# Patient Record
Sex: Female | Born: 1975 | Race: White | Hispanic: No | Marital: Single | Smoking: Current every day smoker
Health system: Southern US, Community
[De-identification: ages and names within clinical notes are randomized; demographics above are authoritative.]

---

## 1999-09-06 ENCOUNTER — Other Ambulatory Visit: Admission: RE | Admit: 1999-09-06 | Discharge: 1999-09-06 | Payer: Self-pay | Admitting: Obstetrics and Gynecology

## 2000-10-30 ENCOUNTER — Ambulatory Visit (HOSPITAL_COMMUNITY): Admission: RE | Admit: 2000-10-30 | Discharge: 2000-10-30 | Payer: Self-pay | Admitting: Neurology

## 2002-01-22 ENCOUNTER — Other Ambulatory Visit: Admission: RE | Admit: 2002-01-22 | Discharge: 2002-01-22 | Payer: Self-pay | Admitting: Obstetrics and Gynecology

## 2002-08-06 ENCOUNTER — Ambulatory Visit (HOSPITAL_COMMUNITY): Admission: RE | Admit: 2002-08-06 | Discharge: 2002-08-06 | Payer: Self-pay | Admitting: Neurology

## 2003-03-02 ENCOUNTER — Other Ambulatory Visit: Admission: RE | Admit: 2003-03-02 | Discharge: 2003-03-02 | Payer: Self-pay | Admitting: Obstetrics and Gynecology

## 2003-09-13 ENCOUNTER — Ambulatory Visit (HOSPITAL_COMMUNITY): Admission: RE | Admit: 2003-09-13 | Discharge: 2003-09-13 | Payer: Self-pay | Admitting: Neurology

## 2003-12-22 ENCOUNTER — Ambulatory Visit (HOSPITAL_COMMUNITY): Admission: RE | Admit: 2003-12-22 | Discharge: 2003-12-22 | Payer: Self-pay | Admitting: Gastroenterology

## 2003-12-28 ENCOUNTER — Ambulatory Visit (HOSPITAL_COMMUNITY): Admission: RE | Admit: 2003-12-28 | Discharge: 2003-12-28 | Payer: Self-pay | Admitting: Gastroenterology

## 2004-01-22 ENCOUNTER — Emergency Department (HOSPITAL_COMMUNITY): Admission: EM | Admit: 2004-01-22 | Discharge: 2004-01-22 | Payer: Self-pay | Admitting: Emergency Medicine

## 2004-01-28 ENCOUNTER — Encounter: Admission: RE | Admit: 2004-01-28 | Discharge: 2004-01-28 | Payer: Self-pay | Admitting: Internal Medicine

## 2004-02-08 ENCOUNTER — Inpatient Hospital Stay (HOSPITAL_COMMUNITY): Admission: EM | Admit: 2004-02-08 | Discharge: 2004-02-17 | Payer: Self-pay | Admitting: Emergency Medicine

## 2004-03-22 ENCOUNTER — Ambulatory Visit (HOSPITAL_COMMUNITY): Admission: RE | Admit: 2004-03-22 | Discharge: 2004-03-22 | Payer: Self-pay | Admitting: Gastroenterology

## 2004-06-19 ENCOUNTER — Other Ambulatory Visit: Admission: RE | Admit: 2004-06-19 | Discharge: 2004-06-19 | Payer: Self-pay | Admitting: Obstetrics and Gynecology

## 2004-09-26 ENCOUNTER — Ambulatory Visit (HOSPITAL_COMMUNITY): Admission: RE | Admit: 2004-09-26 | Discharge: 2004-09-26 | Payer: Self-pay | Admitting: Neurology

## 2004-11-05 IMAGING — RF DG ERCP WO/W SPHINCTEROTOMY
9 series · 9 of 9 positions shown · non-contrast
Comparison: none

CLINICAL DATA: The patient has had a previous cholecystectomy, now thought to have a bile leak.
 ERCP AND STENT PLACEMENT
 ERCP was performed and showed what appeared to be a small bile leak in the region of the inferior surface of the right lobe of the liver. The exact point of leakage cannot be definitely ascertained, but there is definite accumulation of the contrast. Therefore, a 10 French 5 centimeter length stent was placed over the lower common bile duct.
 IMPRESSION 
 There appears to be a biliary leak from the region of the gallbladder fossa of uncertain origin. A 10 French 5 centimeter stent was placed in the lower common bile duct and across the ampulla.

[Series 1: run · 1 of 1 slices shown (1 of 9)]
[im 1/1]
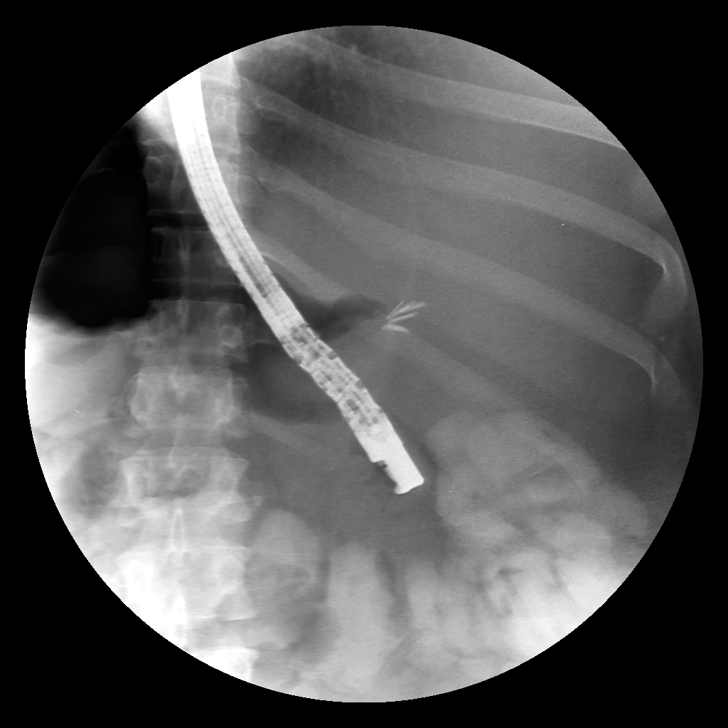

[Series 2: run · 1 of 1 slices shown (2 of 9)]
[im 1/1]
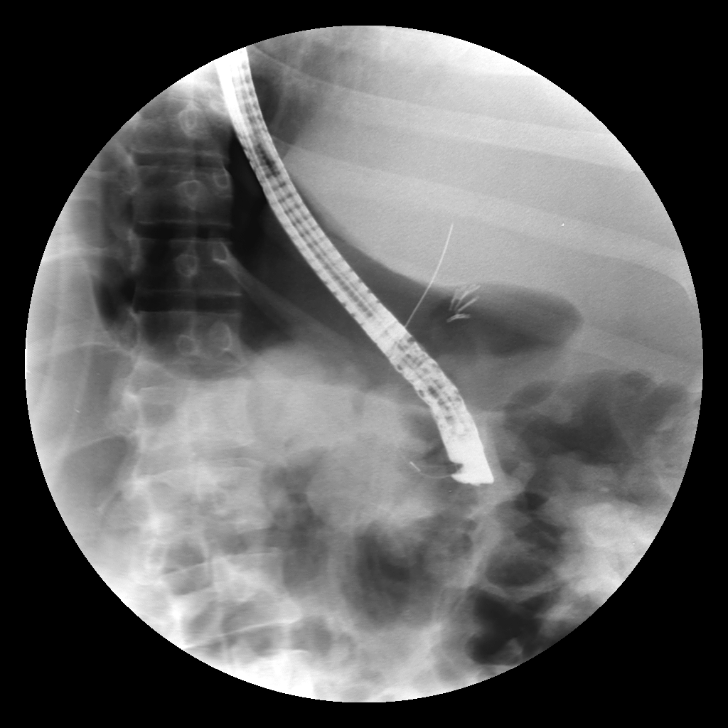

[Series 3: run · 1 of 1 slices shown (3 of 9)]
[im 1/1]
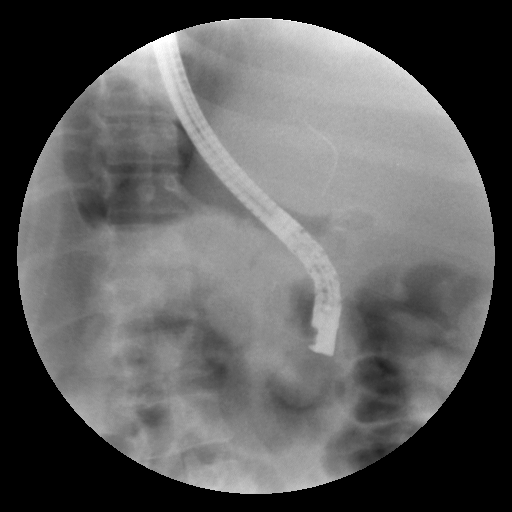

[Series 4: run · 1 of 1 slices shown (4 of 9)]
[im 1/1]
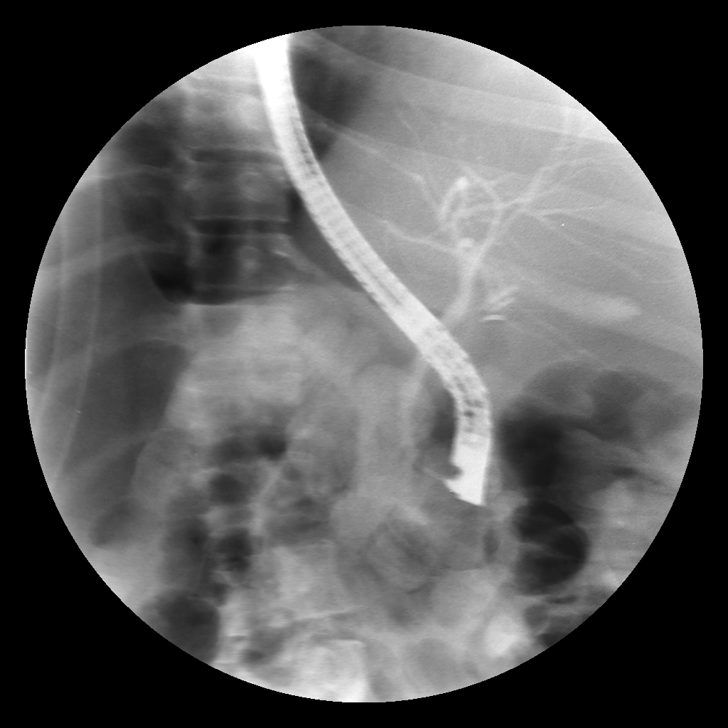

[Series 5: run · 1 of 1 slices shown (5 of 9)]
[im 1/1]
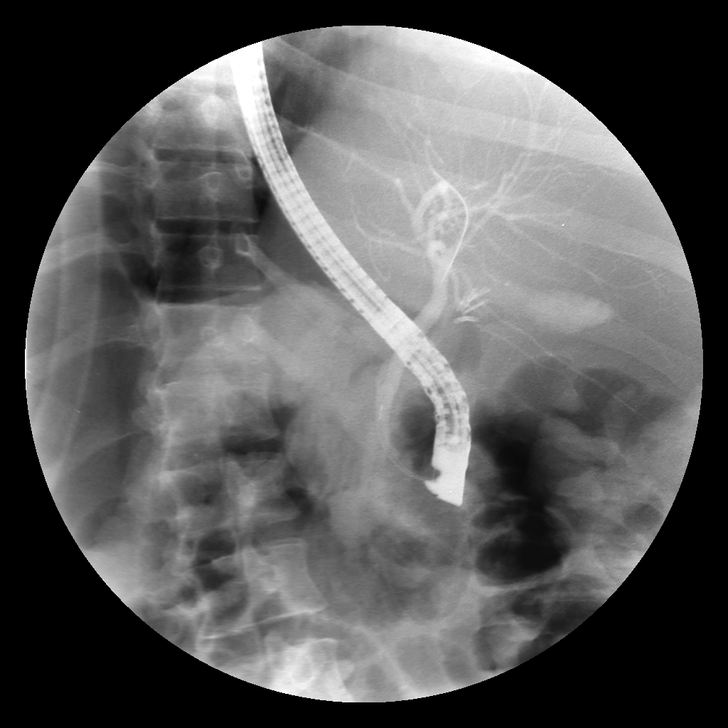

[Series 6: run · 1 of 1 slices shown (6 of 9)]
[im 1/1]
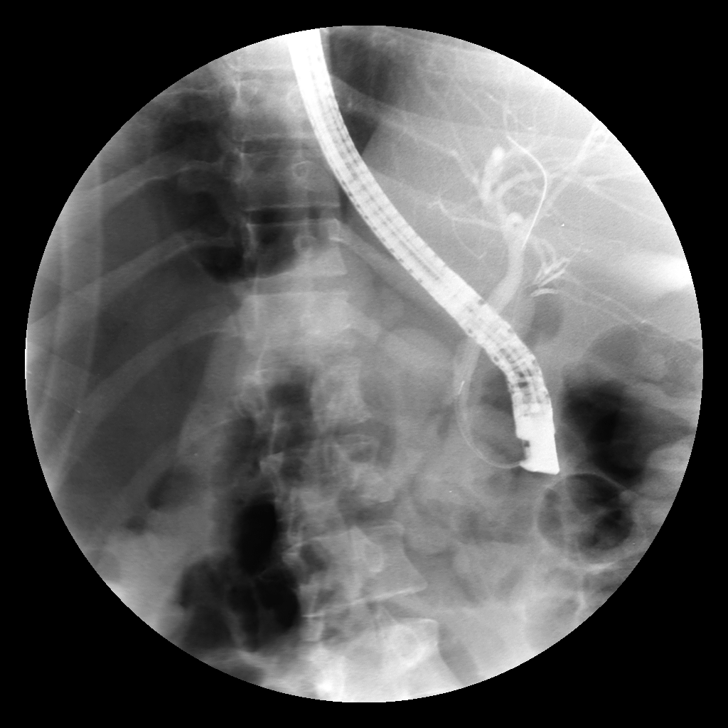

[Series 7: run · 1 of 1 slices shown (7 of 9)]
[im 1/1]
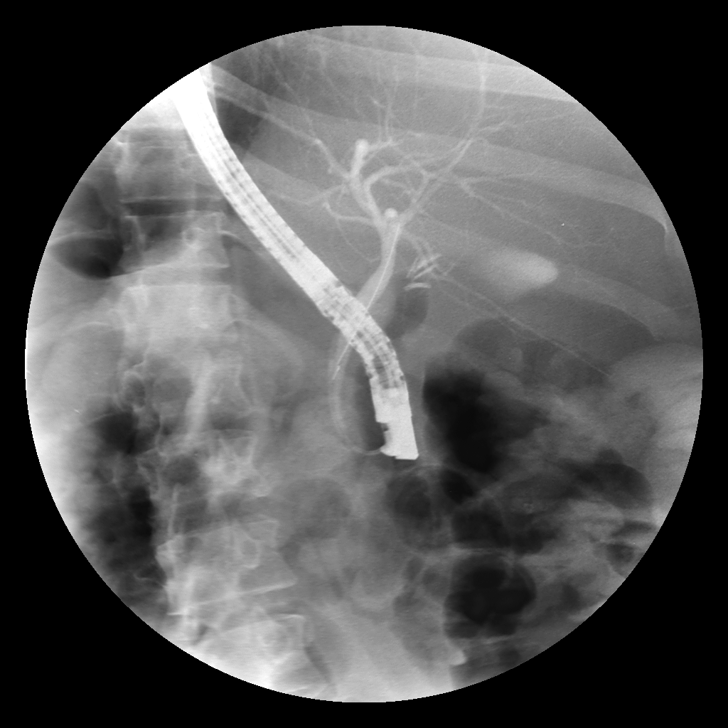

[Series 8: run · 1 of 1 slices shown (8 of 9)]
[im 1/1]
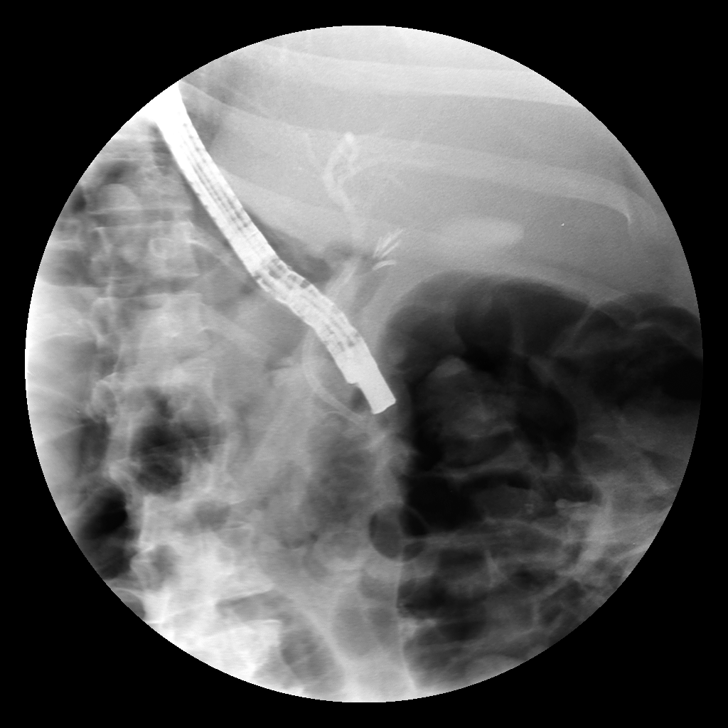

[Series 9: run · 1 of 1 slices shown (9 of 9)]
[im 1/1]
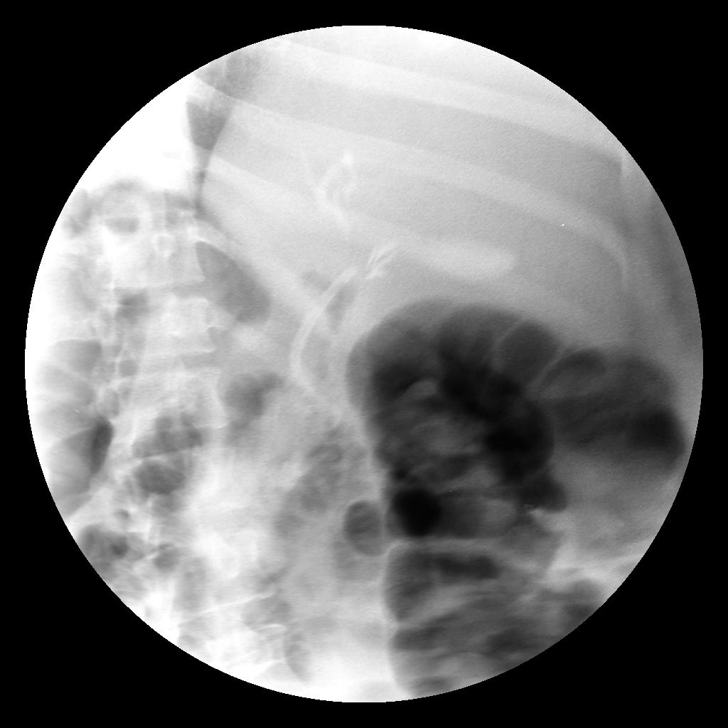

[9 of 9 positions shown; findings below may reference images not displayed]

## 2005-02-02 ENCOUNTER — Other Ambulatory Visit: Admission: RE | Admit: 2005-02-02 | Discharge: 2005-02-02 | Payer: Self-pay | Admitting: Obstetrics and Gynecology

## 2005-05-08 ENCOUNTER — Ambulatory Visit (HOSPITAL_COMMUNITY): Admission: RE | Admit: 2005-05-08 | Discharge: 2005-05-08 | Payer: Self-pay | Admitting: Neurology

## 2005-05-31 ENCOUNTER — Encounter: Admission: RE | Admit: 2005-05-31 | Discharge: 2005-05-31 | Payer: Self-pay | Admitting: Neurology

## 2005-06-05 ENCOUNTER — Ambulatory Visit (HOSPITAL_COMMUNITY): Admission: RE | Admit: 2005-06-05 | Discharge: 2005-06-05 | Payer: Self-pay | Admitting: Neurology

## 2005-11-20 ENCOUNTER — Other Ambulatory Visit: Admission: RE | Admit: 2005-11-20 | Discharge: 2005-11-20 | Payer: Self-pay | Admitting: Obstetrics and Gynecology

## 2005-12-14 ENCOUNTER — Ambulatory Visit (HOSPITAL_COMMUNITY): Admission: RE | Admit: 2005-12-14 | Discharge: 2005-12-14 | Payer: Self-pay | Admitting: Neurology

## 2006-01-29 ENCOUNTER — Ambulatory Visit (HOSPITAL_COMMUNITY): Admission: RE | Admit: 2006-01-29 | Discharge: 2006-01-29 | Payer: Self-pay | Admitting: Neurology

## 2006-05-08 ENCOUNTER — Ambulatory Visit (HOSPITAL_COMMUNITY): Admission: RE | Admit: 2006-05-08 | Discharge: 2006-05-08 | Payer: Self-pay | Admitting: Neurosurgery

## 2006-05-09 ENCOUNTER — Ambulatory Visit (HOSPITAL_COMMUNITY): Admission: RE | Admit: 2006-05-09 | Discharge: 2006-05-11 | Payer: Self-pay | Admitting: Neurosurgery

## 2006-06-14 ENCOUNTER — Inpatient Hospital Stay (HOSPITAL_COMMUNITY): Admission: RE | Admit: 2006-06-14 | Discharge: 2006-06-15 | Payer: Self-pay | Admitting: Neurosurgery

## 2006-06-20 ENCOUNTER — Inpatient Hospital Stay (HOSPITAL_COMMUNITY): Admission: EM | Admit: 2006-06-20 | Discharge: 2006-06-21 | Payer: Self-pay | Admitting: Emergency Medicine

## 2006-07-31 ENCOUNTER — Encounter: Admission: RE | Admit: 2006-07-31 | Discharge: 2006-07-31 | Payer: Self-pay | Admitting: Neurosurgery

## 2006-08-01 ENCOUNTER — Inpatient Hospital Stay (HOSPITAL_COMMUNITY): Admission: EM | Admit: 2006-08-01 | Discharge: 2006-08-09 | Payer: Self-pay | Admitting: Emergency Medicine

## 2006-08-01 DIAGNOSIS — L089 Local infection of the skin and subcutaneous tissue, unspecified: Secondary | ICD-10-CM | POA: Insufficient documentation

## 2006-08-07 ENCOUNTER — Ambulatory Visit: Payer: Self-pay | Admitting: Infectious Diseases

## 2006-08-26 ENCOUNTER — Ambulatory Visit: Payer: Self-pay | Admitting: Infectious Diseases

## 2006-08-29 DIAGNOSIS — E0590 Thyrotoxicosis, unspecified without thyrotoxic crisis or storm: Secondary | ICD-10-CM | POA: Insufficient documentation

## 2006-08-29 DIAGNOSIS — G932 Benign intracranial hypertension: Secondary | ICD-10-CM

## 2006-08-29 DIAGNOSIS — Z9089 Acquired absence of other organs: Secondary | ICD-10-CM | POA: Insufficient documentation

## 2006-08-29 DIAGNOSIS — I10 Essential (primary) hypertension: Secondary | ICD-10-CM | POA: Insufficient documentation

## 2006-09-25 ENCOUNTER — Ambulatory Visit: Payer: Self-pay | Admitting: Infectious Diseases

## 2006-11-28 ENCOUNTER — Encounter: Admission: RE | Admit: 2006-11-28 | Discharge: 2006-11-28 | Payer: Self-pay | Admitting: Neurology

## 2006-12-24 DIAGNOSIS — IMO0001 Reserved for inherently not codable concepts without codable children: Secondary | ICD-10-CM

## 2007-06-12 ENCOUNTER — Ambulatory Visit (HOSPITAL_COMMUNITY): Admission: RE | Admit: 2007-06-12 | Discharge: 2007-06-12 | Payer: Self-pay | Admitting: Neurology

## 2007-08-08 ENCOUNTER — Ambulatory Visit (HOSPITAL_COMMUNITY): Admission: RE | Admit: 2007-08-08 | Discharge: 2007-08-08 | Payer: Self-pay | Admitting: Neurology

## 2007-11-19 ENCOUNTER — Ambulatory Visit (HOSPITAL_COMMUNITY): Admission: RE | Admit: 2007-11-19 | Discharge: 2007-11-19 | Payer: Self-pay | Admitting: Neurology

## 2008-03-23 ENCOUNTER — Ambulatory Visit (HOSPITAL_COMMUNITY): Admission: RE | Admit: 2008-03-23 | Discharge: 2008-03-23 | Payer: Self-pay | Admitting: Neurology

## 2008-08-10 ENCOUNTER — Ambulatory Visit (HOSPITAL_COMMUNITY): Admission: RE | Admit: 2008-08-10 | Discharge: 2008-08-10 | Payer: Self-pay | Admitting: Neurology

## 2008-11-26 ENCOUNTER — Ambulatory Visit (HOSPITAL_COMMUNITY): Admission: RE | Admit: 2008-11-26 | Discharge: 2008-11-26 | Payer: Self-pay | Admitting: Neurology

## 2009-05-11 ENCOUNTER — Ambulatory Visit (HOSPITAL_COMMUNITY): Admission: RE | Admit: 2009-05-11 | Discharge: 2009-05-11 | Payer: Self-pay | Admitting: Neurology

## 2009-11-01 ENCOUNTER — Emergency Department (HOSPITAL_BASED_OUTPATIENT_CLINIC_OR_DEPARTMENT_OTHER): Admission: EM | Admit: 2009-11-01 | Discharge: 2009-11-01 | Payer: Self-pay | Admitting: Emergency Medicine

## 2009-12-13 ENCOUNTER — Ambulatory Visit (HOSPITAL_COMMUNITY): Admission: RE | Admit: 2009-12-13 | Discharge: 2009-12-13 | Payer: Self-pay | Admitting: Neurology

## 2010-08-31 ENCOUNTER — Emergency Department (HOSPITAL_COMMUNITY): Admission: EM | Admit: 2010-08-31 | Discharge: 2010-08-31 | Payer: Self-pay | Admitting: Emergency Medicine

## 2010-12-03 ENCOUNTER — Encounter: Payer: Self-pay | Admitting: Gastroenterology

## 2010-12-03 ENCOUNTER — Encounter: Payer: Self-pay | Admitting: Neurology

## 2011-01-24 LAB — DIFFERENTIAL
Lymphocytes Relative: 21 % (ref 12–46)
Lymphs Abs: 2.3 10*3/uL (ref 0.7–4.0)
Neutro Abs: 8.1 10*3/uL — ABNORMAL HIGH (ref 1.7–7.7)

## 2011-01-24 LAB — APTT: aPTT: 28 seconds (ref 24–37)

## 2011-01-24 LAB — URINALYSIS, ROUTINE W REFLEX MICROSCOPIC
Bilirubin Urine: NEGATIVE
Hgb urine dipstick: NEGATIVE
Ketones, ur: NEGATIVE mg/dL
pH: 6 (ref 5.0–8.0)

## 2011-01-24 LAB — BASIC METABOLIC PANEL
Calcium: 8.9 mg/dL (ref 8.4–10.5)
Chloride: 108 mEq/L (ref 96–112)
Potassium: 3.4 mEq/L — ABNORMAL LOW (ref 3.5–5.1)
Sodium: 138 mEq/L (ref 135–145)

## 2011-01-24 LAB — CBC: MCHC: 33.8 g/dL (ref 30.0–36.0)

## 2011-01-24 LAB — RAPID URINE DRUG SCREEN, HOSP PERFORMED
Benzodiazepines: POSITIVE — AB
Tetrahydrocannabinol: POSITIVE — AB

## 2011-01-24 LAB — URINE MICROSCOPIC-ADD ON

## 2011-01-24 LAB — PROTIME-INR: INR: 0.95 (ref 0.00–1.49)

## 2011-01-31 LAB — PROTEIN AND GLUCOSE, CSF
Glucose, CSF: 65 mg/dL (ref 43–76)
Total  Protein, CSF: 18 mg/dL (ref 15–45)

## 2011-01-31 LAB — CSF CELL COUNT WITH DIFFERENTIAL
RBC Count, CSF: 32 /mm3 — ABNORMAL HIGH
Tube #: 1

## 2011-02-26 LAB — CSF CELL COUNT WITH DIFFERENTIAL
RBC Count, CSF: 8 /mm3 — ABNORMAL HIGH
Tube #: 1
WBC, CSF: 1 /mm3 (ref 0–5)

## 2011-02-26 LAB — PROTEIN AND GLUCOSE, CSF
Glucose, CSF: 64 mg/dL (ref 43–76)
Total  Protein, CSF: 24 mg/dL (ref 15–45)

## 2011-03-27 NOTE — Op Note (Signed)
[name], [name]            ACCOUNT NO.:  [id number]   MEDICAL RECORD NO.:  [id number]          PATIENT TYPE:  OUT   LOCATION:  MDC                          FACILITY:  [facility]   PHYSICIAN:  Michael L. Reynolds, M.D.DATE OF BIRTH:  [birth year]   DATE OF PROCEDURE:  11/19/2007  DATE OF DISCHARGE:                               OPERATIVE REPORT   PROCEDURE:  Therapeutic lumbar puncture.   OPERATOR:  Michael L. Thad Ranger, M.D.   INDICATION:  Idiopathic intracranial hypertension with increasing  headache.   DESCRIPTION OF PROCEDURE:  The patient was placed in the right lateral  decubitus position and prepped and draped in the usual sterile fashion.  Local anesthesia was achieved with 5 mL of lidocaine.  A single attempt  was made to pass a 20-gauge spinal needle through the L3-4 interspace,  which was successful.  Clear CSF emerged with opening pressure of 285  mmH2O.  A total of approximately 30 mL of clear CSF was withdrawn and  sent for laboratory analysis, including cell count, differential,  glucose and protein.  Closing pressure was measured at approximately 130  mmH2O.  The needle was withdrawn and hemostasis was obtained.  No  immediate complications were noted.  The patient will be advised to  remain supine for an hour and then may be discharged home.  She is given  the usual post-LP precautions, including coming to the emergency room  immediately if she develops fever, altered mental status, neck  stiffness, inability to control the bladder or bowels, or paraparesis,  and to call the office or the on-call physician over the next few days  if she develops a postural headache.      Michael L. Thad Ranger, M.D.  Electronically Signed     MLR/MEDQ  D:  11/19/2007  T:  11/19/2007  Job:  [id number]

## 2011-03-27 NOTE — Op Note (Signed)
[name], [name]            ACCOUNT NO.:  [id number]   MEDICAL RECORD NO.:  [id number]          PATIENT TYPE:  OUT   LOCATION:  MDC                          FACILITY:  [facility]   PHYSICIAN:  Michael L. Reynolds, M.D.DATE OF BIRTH:  09/07/1976   DATE OF PROCEDURE:  DATE OF DISCHARGE:                               OPERATIVE REPORT   PROCEDURE:  Therapeutic lumbar puncture.   OPERATOR:  Michael L. Reynolds, MD   INDICATION:  Idiopathic intracranial hypertension (pseudotumor cerebri).   DESCRIPTION OF PROCEDURE:  Informed consent form was signed and placed  in the chart.  After the procedure, risks and benefits were explained to  the patient, she agreed to proceed.  The patient was placed in the right  lateral decubitus position and prepped and draped in the usual sterile  fashion.  Local anesthesia was achieved with 9 mL of lidocaine.  A 20-  gauge spinal needle was inserted at the L4-L5 interspace and advanced  until clear CSF was obtained.  Opening pressure was measured at 320 mm  of water.  Approximately 30 mL of spinal fluid was withdrawn, with  closing pressure measured at 110 mm of water.  Needle was withdrawn and  hemostasis was obtained.  No immediate complications were noted.  The  patient was advised to remain in the supine position for the next hour,  and then will be discharged home.  She was given the usual postoperative  precautions including to coming to the emergency department immediately  if she developed fever, mental status changes, paraparesis, bowel or  bladder incontinence, or unrelenting low back pain, and to call the  office in the next few days if she developed a postural headache.      Michael L. Thad Ranger, M.D.  Electronically Signed     MLR/MEDQ  D:  05/11/2009  T:  05/12/2009  Job:  [id number]

## 2011-03-27 NOTE — Op Note (Signed)
NAMEJAYLIN, ROUNDY            ACCOUNT NO.:  [id number]   MEDICAL RECORD NO.:  [id number]          PATIENT TYPE:  OUT   LOCATION:  MDC                          FACILITY:  [facility]   PHYSICIAN:  Michael L. Reynolds, M.D.DATE OF BIRTH:  01/20/1976   DATE OF PROCEDURE:  DATE OF DISCHARGE:                               OPERATIVE REPORT   PROCEDURE:  Therapeutic lumbar puncture.   OPERATOR:  Michael L. Reynolds, MD   INDICATION:  Idiopathic intracranial hypertension (pseudotumor cerebri)  with an increasing headache.   DESCRIPTION OF PROCEDURE:  Informed consent was obtained.  Informed the  son and placed in the chart.  After the procedure, risks and benefits of  the procedure were explained to the patient, she agreed to proceed.  The  patient was placed in the right lateral decubitus position and prepped  and draped in the usual sterile fashion.  Local anesthesia was achieved  with 4 mL of lidocaine.  A 20-gauge spinal needle was inserted at the L4-  L5 interspace and advanced until clear CSF was obtained.  Opening  pressure was 290 mm of water.  Approximately 15 mL of clear CSF were  removed and sent for laboratory testing as follows:  Tube 1, cell count  differential and tube 2, glucose and protein.  A closing pressure was  measured at 120 mm of water.  Needle was withdrawn and hemostasis was  obtained.  No immediate complications were noted.  The patient was  advised to remain supine for the next hour, and then maybe discharged  from short stay.  She was given the usual postoperative precautions  including coming to the emergency department if she developed  unrelenting low back pain, inability to walk, numbness or weakness of  the lower extremities, bowel or bladder incontinence, fever, neck  stiffness or mental status changes.  She is also advised to call up the  office or the on-call physician over the next couple of days if she  developed postural headache.  She will be seen  back in the office as  routinely scheduled.      Michael L. Thad Ranger, M.D.  Electronically Signed     MLR/MEDQ  D:  11/26/2008  T:  11/27/2008  Job:  [id number]

## 2011-03-27 NOTE — Op Note (Signed)
[name], [name]            ACCOUNT NO.:  [id number]   MEDICAL RECORD NO.:  [id number]          PATIENT TYPE:  OUT   LOCATION:  MDC                          FACILITY:  [facility]   PHYSICIAN:  Michael L. Reynolds, M.D.DATE OF BIRTH:  Nov 01, 1976   DATE OF PROCEDURE:  08/08/2007  DATE OF DISCHARGE:                               OPERATIVE REPORT   PROCEDURE:  Diagnostic/therapeutic lumbar puncture.   INDICATIONS:  Pseudotumor cerebri.  She has had a persistent headache  since her last tap.  This LP is performed for measurement of pressure to  exclude high or low pressure headache.   OPERATOR:  Michael L. Reynolds, M.D.   DESCRIPTION OF PROCEDURE:  Informed consent form was placed inside the  chart after the procedure risks and benefits were explained to the  patient.  She agreed to proceed.  The patient was placed in the right  lateral decubitus position and prepped and draped in the usual sterile  fashion.  Local anesthesia was achieved with 5 mL of lidocaine at the L3-  4 level.  A 20 gauge spinal needle was advanced in the L3-4 and was felt  to be in the interspace, but no fluid was returned.  Subsequently, the  L2-3 level was anesthetized with 5 mL of lidocaine.  The needle was  advanced until clear CSF was obtained.  Opening pressure was 220 mm of  water.  Approximately 12 mL of CSF were collected and sent for routine  laboratory testing including CBC, differential, glucose and protein.  Closing pressure was measured at 135 mm of water.  The needle was  withdrawn and hemostasis was obtained.  No immediate complications were  noted.   The patient will remain supine for an hour following the procedure and  then will be discharged home.  She will be given the usual post-LP  precautions, including coming to the emergency department if she  develops, fever, alteration in mental status, unremitting low back pain,  weakness or numbness in the legs, bowel or bladder incontinence or  to  call the office or the on-call doctor over the next several days if she  develops a postural headache.      Michael L. Thad Ranger, M.D.  Electronically Signed     MLR/MEDQ  D:  08/08/2007  T:  08/08/2007  Job:  [id number]

## 2011-03-27 NOTE — Op Note (Signed)
[name], CONSTANTIN            ACCOUNT NO.:  [id number]   MEDICAL RECORD NO.:  [id number]          PATIENT TYPE:  AMB   LOCATION:  SDS                          FACILITY:  [facility]   PHYSICIAN:  Michael L. Reynolds, M.D.DATE OF BIRTH:  [birth year]   DATE OF PROCEDURE:  06/12/2007  DATE OF DISCHARGE:                               OPERATIVE REPORT   PROCEDURE PERFORMED:  Therapeutic lumbar puncture.   SURGEON:  Casimiro Needle L. Thad Ranger, M.D.   INDICATIONS FOR PROCEDURE:  Pseudotumor cerebri with increasing  headache.   DESCRIPTION OF PROCEDURE:  The informed consent form was signed and  placed in the chart after the procedure, risks and benefits were  explained to the patient and she agreed to proceed.  The patient was  placed in the right lateral decubitus position, and prepped and draped  in the usual sterile manner.  Local anesthesia was achieved with 7 mL of  lidocaine.  A 20-gauge spinal needle was inserted into the L3-L4  interspace and advanced until clear CSF was obtained.  Opening pressure  was measured at 29 cm of water.  Approximately 24 mL of CSF was  subsequently drained without difficulty.  The closing pressure was  measured at 11 cm of water.  The needle was withdrawn and hemostasis was  obtained.   The patient will remain supine in the short stay area for one hour and  then will be discharged home.  Routine CSF labs including cell count,  glucose and protein will be obtained.  The patient was given the usual  post LP precautions and advised to follow up in the office in a few  weeks or to call sooner if she developed numbness, tingling or weakness  in the lower extremities, bowel or bladder incontinence, unremitting low  back pain, fever or alteration of consciousness, and to call over the  next few days if she develops a postural headache.      Michael L. Thad Ranger, M.D.  Electronically Signed     MLR/MEDQ  D:  06/12/2007  T:  06/12/2007  Job:  [id number]

## 2011-03-27 NOTE — Op Note (Signed)
NAMEADANNA, ZUCKERMAN            ACCOUNT NO.:  [id number]   MEDICAL RECORD NO.:  [id number]          PATIENT TYPE:  OUT   LOCATION:  MDC                          FACILITY:  [facility]   PHYSICIAN:  Michael L. Reynolds, M.D.DATE OF BIRTH:  Mar 14, 1976   DATE OF PROCEDURE:  08/10/2008  DATE OF DISCHARGE:                               OPERATIVE REPORT   PROCEDURE:  Therapeutic lumbar puncture.   OPERATOR:  Michael L. Reynolds, MD.   INDICATION:  Pseudotumor cerebri.   DESCRIPTION OF PROCEDURE:  Informed consent was obtained and form was  signed, placed in the chart after the procedure risks and benefits were  explained to the patient, and she agreed to proceed.  The patient was  placed in the right lateral decubitus position and prepped and draped in  the usual sterile fashion.  Local anesthesia was achieved with  lidocaine.  Initially attempts were made to pass a 20-gauge spinal  needle through the L3-4 interspace.  This resulted in cannulation of the  space, without return of CSF.  After multiple attempts, failed to return  fluid, the skin and subcutaneous tissue above the L4-5 interspace was  anesthetized with lidocaine.  A 20-gauge spinal needle was then  successfully passed into the thecal sac, and clear fluid was returned.  Opening pressure was 230 mm of water.  Approximately 20 mL was then  drawn off, for closing pressure of 110 mm of water.  The needle was  withdrawn.  Hemostasis was obtained.  No immediate complications were  noted.  The patient will remain supine in the Short-Stay area for 1  hour, then will be discharged home.  She will be given usual post LP  precautions including presenting to the emergency department for fever,  stiff neck, underlying low back pain, weakness in lower extremities, or  mental status changes, and to call over the next few days if she  develops an unrelenting postural headache.  She will follow up in the  office in few weeks as  scheduled.      Michael L. Thad Ranger, M.D.  Electronically Signed     MLR/MEDQ  D:  08/10/2008  T:  08/11/2008  Job:  [id number]

## 2011-03-30 NOTE — Op Note (Signed)
NAMEERSEL, [name]            ACCOUNT NO.:  [id number]   MEDICAL RECORD NO.:  [id number]          PATIENT TYPE:  OUT   LOCATION:  MDC                          FACILITY:  [facility]   PHYSICIAN:  Michael L. Reynolds, M.D.DATE OF BIRTH:  1976/08/16   DATE OF PROCEDURE:  05/08/2005  DATE OF DISCHARGE:                                 OPERATIVE REPORT   PROCEDURE:  Therapeutic lumbar puncture.   OPERATOR:  Michael L. Thad Ranger, M.D.   INDICATION:  Idiopathic intracranial hypertension with increasing headache.   DESCRIPTION OF PROCEDURE:  Informed consent form was signed and placed in  the chart after the procedure risks and benefits were explained and the  patient agreed to proceed.  The patient was placed in the right lateral  decubitus position and prepped and draped in the usual sterile fashion.  Local anesthesia was achieved with 7 cubic centimeters of lidocaine.  A 20  gauge spinal needle was inserted into theL3-4 interspace and advanced until  clear CSF was obtained.  Opening pressure was 320 mm of water.  CSF was  withdrawn into two tubes for routine measure of cell count, differential,  glucose, and protein.  Additional fluid was then drained to achieve a  closing pressure of 190 mm of water.  The needle was withdrawn and  hemostasis was obtained.  No immediate complications were noted.  The  patient will be discharged following one-hour observation following the  procedure.  She will be in the usual post LP precautions including alerting  [place] immediately if she develops fever, neck stiffness, mental status changes,  bowel or bladder incontinence, or numbness or paralysis of the lower  extremities, and to call over the next couple of days if she develops a  postural headache.       MLR/MEDQ  D:  05/08/2005  T:  05/08/2005  Job:  [id number]

## 2011-03-30 NOTE — Consult Note (Signed)
[name], BRAMEL                        ACCOUNT NO.:  [id number]   MEDICAL RECORD NO.:  [id number]                   PATIENT TYPE:   LOCATION:                                       FACILITY:   PHYSICIAN:  Ollen Gross. Vernell Morgans, M.D.              DATE OF BIRTH:   DATE OF CONSULTATION:  02/10/2004  DATE OF DISCHARGE:                                   CONSULTATION   CONSULTING PHYSICIAN:  Ollen Gross. Carolynne Edouard, M.D.   [name] is a 35 year old white female who underwent laparoscopic  cholecystectomy by Dr. Maryagnes Amos, on January 19, 2004.  She tolerated this well  initially but on post-op day three developed fevers to 102 and generalized  malaise.  She also experienced nausea and some vomiting.  Over the last few  days, the nausea and vomiting have resolved.  She continues to be febrile,  and she has no complaints of abdominal pain.  She underwent a recent MRI  which did show some fluid in the gallbladder fossa the size of which seemed  to be stable compared to previuos CTs, but it also showed some small fluid  at the tip of the liver as well as the tip of the spleen which was new.  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   PAST MEDICAL HISTORY:  1. Pseudotumor cerebri.  2. Obesity.  3. Hypothyroidism.   PAST SURGICAL HISTORY:  Laparoscopic cholecystectomy.   MEDICATIONS:  Oral contraceptives, aspirin, and Synthroid.   ALLERGIES:  1. CODEINE.  2. TEQUIN.   SOCIAL HISTORY:  She quit smoking about two years ago.  Denies any alcohol  use.   FAMILY HISTORY:  Significant for hypertension in her mother and father.   PHYSICAL EXAMINATION:  VITAL SIGNS:  Her T-max in the last 24 hours was  101.9, blood pressure 140/80, pulse of 119.  GENERAL:  She is a well-developed, well-nourished, white female in no acute  distress.  SKIN:  Warm and dry with no jaundice.  EYES:  Her extraocular muscles are intact.  Pupils equal, round and reactive  to light.   Sclerae nonicteric.  LUNGS:  Clear bilaterally with no use of accessory respiratory muscles.  HEART:  Regular, rate and rhythm with an impulse in the left chest.  ABDOMEN:  Soft and nontender with no palpable mass or hepatosplenomegaly.  Her incisions are healing nicely.  EXTREMITIES:  No cyanosis, clubbing or edema.  PSYCHOLOGIC:  She is alert and oriented  x 3 with no evidence of anxiety or  depression.   Her most recent white count was 6,900 with a hemoglobin of 10.2, a platelet  count of 135,000.  Her most recent sodium was 137, potassium 3.5, chloride  106, CO2 26, BUN 8, creatinine 1.1, glucose 103.  Her SGOT and SGPT were 131  and 121, alk phos 107 and total bili 0.9.  ASSESSMENT/PLAN:  This is a 35 year old white female, status post  laparoscopic cholecystectomy, about three weeks ago, who has had some  persistent generalized malaise, and nausea, and fevers.  It is certainly  possible that the fluid that showed up on the MRI study today could be a  very small bile leak, although the size of the fluid in the gallbladder  fossa is stable from CT scans from two weeks ago and does not seemed to have  changed much, and she had a normal hepatobiliary scan at that time as well.  Certainly, if this is a small bile leak, she is scheduled for an ERCP  tomorrow which should be able to demonstrate it and also will be therapeutic  with stenting of the bile duct.  The fluid itself is too small to tap or  drain and, I believe, will simply require close monitoring.  It is unclear  what the significance of the splenic infarct is on the MR study, and this  seems to be a new finding over the last few days as the splenic did not show  up on the CT scan from two weeks ago.  We will await the results of the ERCP  tomorrow and continue to follow her closely with you.                                               Ollen Gross. Vernell Morgans, M.D.    PST/MEDQ  D:  02/10/2004  T:  02/12/2004  Job:  [id number]

## 2011-03-30 NOTE — Op Note (Signed)
[name], [name]                      ACCOUNT NO.:  [id number]   MEDICAL RECORD NO.:  [id number]                   PATIENT TYPE:  INP   LOCATION:  5040                                 FACILITY:  [facility]   PHYSICIAN:  Graylin Shiver, M.D.                DATE OF BIRTH:  Jun 18, 1976   DATE OF PROCEDURE:  02/11/2004  DATE OF DISCHARGE:                                 OPERATIVE REPORT   PROCEDURE:  Endoscopic retrograde cholangiogram with biliary stent  placement.   INDICATIONS FOR PROCEDURE:  Suspected bile leak status post cholecystectomy.   CONSENT:  Informed consent was obtained after explanation of the risks of  bleeding, infection, perforation, and pancreatitis.   PREMEDICATION:  Fentanyl 175 mcg IV, Versed 20 mg IV, 0.25 mg Glucagon.   PROCEDURE IN DETAIL:  With the patient lying on her abdomen on the  fluoroscopy table in x-ray, the lateral viewing duodenoscope was inserted  into the oropharynx and passed into the esophagus.  It was advanced down the  esophagus, into the stomach, and into the duodenum.  The papilla of Vater  was localized, it looked normal.  I could see some clear bile draining from  this.  Cannulation was initially achieved of the pancreatic duct using a  guide-wire.  No contrast was injected.  The pancreatic duct was catheterized  several times with a guide-wire but after repositioning, I was able to  selectively cannulate the common bile duct with the guide-wire and  sphincterotome.  Contrast was then injected selectively into the biliary  tree.  Radiographs were obtained.  The radiologist was present during the  initial injection.  We were able to see a small blush of extravasated  contrast coming out of a small duct just adjacent to the region of the clips  of prior cholecystectomy.  A 10 French 5 cm biliary stent was then placed  into the common bile duct with good bile flow noted.  She tolerated the  procedure well without complications.   IMPRESSION:  Small bile leaking coming off a small duct adjacent to the  clips from her recent cholecystectomy.  A 10 French 5 cm biliary stent was  placed in the common bile duct.   I would recommend leaving the stent in place for 4-6 weeks.  We could then  bring her back as an outpatient to remove the stent.                                               Graylin Shiver, M.D.    Germain Osgood  D:  02/11/2004  T:  02/11/2004  Job:  [id number]   cc:   Lilla Shook, M.D.  [address]  [place]  [place] [address]  Fax: [phone]   Darius Bump,  M.D.  [address]  [place] [address]  Fax: [phone]   Gita Kudo, M.D.  [address], [address]  [place]  [place] [address]  Fax: [phone]

## 2011-03-30 NOTE — Op Note (Signed)
NAME:  BROWNIE, GOCKEL                      ACCOUNT NO.:  [id number]   MEDICAL RECORD NO.:  [id number]                   PATIENT TYPE:  AMB   LOCATION:  ENDO                                 FACILITY:  [facility]   PHYSICIAN:  Graylin Shiver, M.D.                DATE OF BIRTH:  1976/10/04   DATE OF PROCEDURE:  03/22/2004  DATE OF DISCHARGE:                                 OPERATIVE REPORT   PROCEDURE PERFORMED:  Upper endoscopy with biliary stent removal.   ENDOSCOPIST:  Graylin Shiver, M.D.   INDICATIONS FOR PROCEDURE:  This patient had a bile leak, status post  cholecystectomy.  She had a biliary stent placed on February 11, 2004.  She had  done well since the stent placement.  She feels fine.  She is here today for  biliary stent removal.  Informed consent was obtained after the risks of  bleeding, infection and perforation.   PREMEDICATIONS:  Fentanyl 80 mcg IV, Versed 7 mg IV.   DESCRIPTION OF PROCEDURE:  With the patient in the left lateral decubitus  position the Olympus lateral viewing duodenoscope was inserted into the  oropharynx and passed into the esophagus.  It was advanced down the  esophagus and into the stomach and into the duodenum.  No abnormalities were  noted in the stomach or the duodenum.  The biliary stent was seen coming out  of the ampulla of Vater.  It was grasped with a snare and removed and  brought out.  The patient tolerated the procedure well without  complications.   IMPRESSION:  Biliary stent removed.                                               Graylin Shiver, M.D.    Germain Osgood  D:  03/22/2004  T:  03/22/2004  Job:  [id number]   cc:   Lilla Shook, M.D.  [address]  [place]  [place] [address]  Fax: [phone]   Darius Bump, M.D.  [phone] [address]  [place] [address]  Fax: [phone]   Gita Kudo, M.D.  [address], [address]  [place]  [place] [address]  Fax: [phone]

## 2011-03-30 NOTE — Op Note (Signed)
[name], [name]            ACCOUNT NO.:  [id number]   MEDICAL RECORD NO.:  [id number]           PATIENT TYPE:   LOCATION:                                 FACILITY:   PHYSICIAN:  Coletta Memos, M.D.          DATE OF BIRTH:   DATE OF PROCEDURE:  06/14/2006  DATE OF DISCHARGE:                                 OPERATIVE REPORT   PREOPERATIVE DIAGNOSIS:  1.  Shunt malfunction.  2.  Pseudotumor cerebri.   POSTOPERATIVE DIAGNOSIS:  1.  Shunt malfunction.  2.  Pseudotumor cerebri.   PROCEDURE:  Revision of lumboperitoneal shunt.   CO-SURGEONS:  Coletta Memos, M.D. and Cherylynn Ridges, M.D.   ANESTHESIA:  General endotracheal.   INDICATIONS:  [name] is a 35 year old woman with pseudotumor  cerebri.  She had a lumboperitoneal shunt placed approximately 3 weeks ago.  She was doing well, but then subsequently had a fluid collection underneath  her lumbar incision.  I had her go for a CT scan of the abdomen; and it  showed that all of the catheter had pulled out of the peritoneum and it  coiled itself into the lumbar area.  I, therefore, had her come in today for  revision of the shunt.   OPERATIVE NOTE:  For the first part of the operation, I placed [name]  in a lateral decubitus position with the left side up.  Her abdominal  incision, flank and back were all prepped and draped in a sterile fashion.  I opened the lumbar incision and when I got through the superficial tissue I  had the immediate egress of clear fluid which certainly had the appearance  of spinal fluid.  Pseudocapsule had formed.  I was able then untangle the  coiled catheter; and I had spontaneous flow out of the distal end of the  catheter.  There was also an area which appeared to have a slit in it; and I  was able identify that; and with a straight connector cut out that area, and  then reconnected the lumbar portion of the catheter.  With spontaneous flow  achieved, I then threaded the catheter  subcutaneously from the lumbar region  to the abdominal incision; and I placed it there; and then placed 3 sutures  to close that wound.   I closed the flank wound with a Vicryl suture and then subsequent staples.  Then after she had spontaneous flow from the spine, I closed that wound in  layered fashion using Vicryl sutures; and then dressed that with Dermabond.  At this point in time I then had the patient placed back in a supine  position; and her abdomen was then prepped; and she was draped again.  At  this point Dr. Megan Mans opened the abdominal incision; and under separate  cover will dictate his opening.  Once he had achieved an opening into the  peritoneum, I then placed a catheter after lengthening it by attaching a  straight stainless steel connector in line with it; and placed that into the  peritoneal cavity without  difficulty.  I placed the pursestring around the peritoneal opening, a 3-0  silk suture.  I then reapproximated the rectus abdominis fascia and then I  closed the wound in a layered fashion using the Vicryl sutures and then  staples on the skin.  [name]'s tolerated the procedure well.           ______________________________  Coletta Memos, M.D.     KC/MEDQ  D:  06/14/2006  T:  06/15/2006  Job:  [id number]

## 2011-03-30 NOTE — Procedures (Signed)
. [facility]  Patient:    [name], [name]                   MRN: [id number] Proc. Date: 10/30/00 Adm. Date:  [id number] Attending:  Fenton Malling                           Procedure Report  DATE OF BIRTH:  1976-09-23.  PROCEDURE PERFORMED:  Diagnostic/therapeutic lumbar puncture.  OPERATIVE FINDINGS:  [name], M.D.  INDICATIONS FOR PROCEDURE:  Suspected idiopathic intracranial hypertension.  DESCRIPTION OF PROCEDURE:  Informed consent was obtained and placed in the chart after the indications, benefits and risks were discussed with the patient and she agreed to proceed.  The low back was prepped and draped in the usual sterile fashion.  Local anesthesia was achieved with 2 cc of 1% lidocaine.  A 20 gauge spinal needle was gradually inserted into the subcutaneous tissues and moved to the L3-4  interspace with the patient in the right lateral decubitus position and the needle was advanced until clear spinal fluid was obtained.  Opening pressure was measured and was 520 mmH2O. Approximately 8 cc of fluid was subsequently withdrawn and was sent for the following studies.  Tube #1 cell count and differential, tube #2 protein and glucose, tube #3 Gram stain and culture, tube #4 hold.  Additional fluid was also withdrawn to reduce the closing pressure.  Closing pressure was 200 mmH2O.  The patient did complain of a mild headache during the procedure.  The needle was withdrawn and hemostasis obtained.  The patient was placed again in a flat supine position and reported that her headache was much improved upon assuming that position.  A sterile bandage was placed over the injection site. No further complications were noted. DD:  10/30/00 TD:  10/30/00 Job: [id number]

## 2011-03-30 NOTE — Discharge Summary (Signed)
NAMEMESHIA, [name]            ACCOUNT NO.:  [id number]   MEDICAL RECORD NO.:  [id number]          PATIENT TYPE:  INP   LOCATION:  3008                         FACILITY:  [facility]   PHYSICIAN:  Coletta Memos, M.D.     DATE OF BIRTH:  11/08/1976   DATE OF ADMISSION:  08/01/2006  DATE OF DISCHARGE:  08/09/2006                                 DISCHARGE SUMMARY   ADMISSION DIAGNOSES:  1. Abdominal pain.  2. Fever, unknown etiology.   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   PROCEDURE:  August 04, 2006, removal of lumbar peritoneal shunt.   FINDINGS:  Positive cultures from the distal tip and CSF Acinetobacter  baumannii. The patient is being treated with Imipenem 500 mg q.6h. x14 days.  She will be sent home for home IV therapy.  At discharge, she is alert,  oriented x4 and answering all questions appropriately.  Her lumbar incision  was oversewn yesterday as she was leaking what I presented to be spinal  fluid from that region.  There was no leak at the time of discharge.  She  has a normal neurologic examination at discharge.   [name] was admitted after running a fever and have an elevated white  count up to 18,000.  She was febrile to over 103 degrees while admitted.  She was eventually taken to the OR on August 04, 2006, for removal of the  shunt.  Postoperatively, she has done well.  She had a CT of the pelvis  which showed a ovarian cyst complex fluid collection.  She did have a  gynecologic consultation.  He has made plans to see her post discharge.  She  will see me in approximately 14 days for suture removal from her spine.  Her  abdominal wound is clean, dry and not showing any signs of infection nor is  the posterior wound.  She also received some Vicodin 10 mg 325, hydrocodone  10/325 and Flexeril.           ______________________________  Coletta Memos, M.D.     KC/MEDQ  D:  08/09/2006  T:  08/11/2006  Job:  [id number]

## 2011-03-30 NOTE — H&P (Signed)
NAMEPRABHLEEN, [name]            ACCOUNT NO.:  [id number]   MEDICAL RECORD NO.:  [id number]          PATIENT TYPE:  INP   LOCATION:  3008                         FACILITY:  [facility]   PHYSICIAN:  Payton Doughty, M.D.      DATE OF BIRTH:  25-Apr-1976   DATE OF ADMISSION:  08/01/2006  DATE OF DISCHARGE:                                HISTORY & PHYSICAL   ADMITTING DIAGNOSIS:  Abdominal pain and pseudotumor cerebri.   SERVICE:  Neurosurgery.   HISTORY:  This is a 35 year old, right handed, white female with abdominal  pain.  She had a lumboperitoneal shunt placed in early August, required two  abdominal approaches on consecutive days, then revision in mid-August for a  thecal sack dislodgment.  She said she has had abdominal pain since the  initial operation but worsened yesterday.  I saw her in the office  yesterday.  Abdominal exam was benign with positive bowel sounds.  She had a  white count of 12,000.  CT showed mild colonic wall thickening, no fluid  collection.  She called today with the same complaints of abdominal pain,  white count is 18,000, reports a temp of 101.5 to 102.  She reported pain in  the mid epigastrium, has eaten some today, reports nausea and vomiting after  drinking the contrast for her CT yesterday.   MEDICAL HISTORY:  1. Hypertension.  2. Pseudotumor cerebri.   ALLERGIES:  KEFLEX.   MEDICATIONS:  Diovan.   SURGICAL HISTORY:  1. Cholecystectomy with subsequent bile leak.  2. Shunt as above.   PHYSICAL EXAMINATION:  HEENT:  Within normal limits.  NECK:  Supple with no lymphadenopathy.  CHEST:  Clear.  CARDIAC:  Regular rate and rhythm without a murmur.  ABDOMEN:  Large but it has a well healed incision bilaterally.  It is soft  without rebound, except tenderness just under the xiphoid.  She has positive  bowel sounds.  BACK:  The lumbar incisions are well healed.  EXTREMITIES:  Without clubbing or cyanosis.  GU:  Deferred.  NEUROLOGIC:  She is awake,  alert, and oriented x3.  Her cranial nerves are  intact.  Motor exam shows 5/5 strength throughout the upper and lower  extremities.  There is no pronator drift and gait is normal.  Reflexes are 1  throughout the upper and lower extremities.  Toe are indifferent.   ASSESSMENT:  Concern of shunt infection, although the patient is not showing  meningitis or peritonitis.   I will admit her and observe her, treat her with ciprofloxacin empirically,  and have general surgery visit to elicit their opinion about her CT scan and  about her abdominal exam.           ______________________________  Payton Doughty, M.D.     MWR/MEDQ  D:  08/01/2006  T:  08/04/2006  Job:  [id number]

## 2011-03-30 NOTE — Op Note (Signed)
[name], [name]            ACCOUNT NO.:  [id number]   MEDICAL RECORD NO.:  [id number]          PATIENT TYPE:  OIB   LOCATION:  3172                         FACILITY:  [facility]   PHYSICIAN:  Cherylynn Ridges, M.D.    DATE OF BIRTH:  [birth year]   DATE OF PROCEDURE:  05/09/2006  DATE OF DISCHARGE:                                 OPERATIVE REPORT   PREOPERATIVE DIAGNOSIS:  Pseudotumor with increased intracranial pressure.   POSTOPERATIVE DIAGNOSIS:  Pseudotumor with increased intracranial pressure.   PROCEDURE:  Peritoneal exposure for placement of lumboperitoneal shunt.   SURGEON:  Cherylynn Ridges, M.D.   ASSISTANT:  Coletta Memos, M.D.   ANESTHESIA:  General endotracheal.  She was done in the and right lateral  decubitus position.  Minimal blood loss.   BRIEF SUMMARY OF OPERATIVE PROCEDURE:  The patient was taken to the  operating room and placed on the table initially in the supine position.  After an adequate endotracheal anesthetic was administered, she was placed  in the right lateral decubitus position as she had had a previous right down  attempted peritoneal placement.  A transverse incision was made  approximately 8-10 cm long across the level of the rectus muscle and just  above the umbilicus.  It was taken down to the rectus sheath using  electrocautery with hemostasis being obtained with electrocautery.  We  entered the anterior rectus sheath, exposed the rectus muscle, then split  the muscle along its fibers and used a Balfour retractor to stay into the  deep subcutaneous tissue, and [employer] retractors to expose the posterior  rectus sheath. This was grasped with a Kocher clamp then subsequently  incised into the posterior sheath into an area preperitoneal fat.  With some  very vigorous attempts to get into the peritoneal cavity, we able to do so  by nicking the peritoneum with Metzenbaum scissors then subsequently passed  our finger into the peritoneal cavity feeling  the smooth inner lining of the  peritoneal space.  Once this was done, we passed a red Robinson catheter  easily into the peritoneal space.  This was left exposed for the placement  of the lumboperitoneal shunt.  Closure will be done by Dr. Franky Macho and  dictated separately.      Cherylynn Ridges, M.D.  Electronically Signed     JOW/MEDQ  D:  05/09/2006  T:  05/09/2006  Job:  [id number]   cc:   Coletta Memos, M.D.  Fax: [phone]

## 2011-03-30 NOTE — Op Note (Signed)
NAMERINDI, BEECHY            ACCOUNT NO.:  [id number]   MEDICAL RECORD NO.:  [id number]          PATIENT TYPE:  OUT   LOCATION:  MDC                          FACILITY:  [facility]   PHYSICIAN:  Michael L. Reynolds, M.D.DATE OF BIRTH:  Sep 04, 1976   DATE OF PROCEDURE:  09/26/2004  DATE OF DISCHARGE:                                 OPERATIVE REPORT   PROCEDURE:  Diagnostic and therapeutic lumbar puncture.   SURGEON:  Casimiro Needle L. Thad Ranger, M.D.   INDICATIONS FOR PROCEDURE:  Pseudotumor cerebri.   DESCRIPTION OF PROCEDURE:  Informed consent was obtained and placed on the  chart after the procedure risks and benefits were explained to the patient  and she agreed to proceed.  The patient was placed in the right lateral  decubitus position and prepped and draped in the usual sterile fashion.  Local anesthesia was achieved with approximately 10 mL of lidocaine.  A 20-  gauge spinal needle was inserted into the L3-4 interspace and advanced until  clear CSF was obtained.  Opening pressure was measured at 350 mmH2O.  A  total of 25 mL was withdrawn and collected for laboratory analysis as  follows.  Tube #1 - cell count differential.  Tube #2 - glucose and protein.  The remainder fluid held in the lab for further analysis as needed.  Closing  pressure was measured at 1230 mmH2O.  Needle was withdrawn.  Hemostasis was  obtained.  The patient did complain of a headache late in the procedure  which has happened before on previous therapeutic taps, otherwise no  complications were noted.  She was advised to remain supine for one hour and  then will be discharged home with the usual post LP precautions and was  asked to call in a few days if she developed a persistent postural headache.       MLR/MEDQ  D:  09/26/2004  T:  09/26/2004  Job:  [id number]

## 2011-03-30 NOTE — Op Note (Signed)
NAME:  [name], [name]                      ACCOUNT NO.:  [id number]   MEDICAL RECORD NO.:  [id number]                   PATIENT TYPE:  AMB   LOCATION:  ENDO                                 FACILITY:  [facility]   PHYSICIAN:  Graylin Shiver, M.D.                DATE OF BIRTH:  May 27, 1976   DATE OF PROCEDURE:  12/22/2003  DATE OF DISCHARGE:                                 OPERATIVE REPORT   PROCEDURE PERFORMED:  Esophagogastroduodenoscopy.   ENDOSCOPIST:  Graylin Shiver, M.D.   INDICATIONS FOR PROCEDURE:  Heartburn.  Informed consent was obtained after  the explanation of risks of bleeding, infection, perforation.   PREMEDICATIONS:  Fentanyl 50 mcg IV, Versed 7.5 mg IV.   DESCRIPTION OF PROCEDURE:  With the patient in the left lateral decubitus  position the Olympus gastroscope was inserted into the oropharynx and passed  into the esophagus.  It was advanced down the esophagus and into the stomach  and into the duodenum.  The second portion and bulb of the duodenum were  normal.  The stomach looked normal in its entirety including the upper  fundus and cardia seen on retroflexion.  The esophagus looked normal.  The  esophagogastric junction was at 35 cm. The entire esophageal mucosa looked  normal.  The patient tolerated the procedure well without complications.   IMPRESSION:  Normal esophagogastroduodenoscopy.                                               Graylin Shiver, M.D.    Germain Osgood  D:  12/22/2003  T:  12/22/2003  Job:  [id number]

## 2011-03-30 NOTE — Op Note (Signed)
   [name], [name]                      ACCOUNT NO.:  [id number]   MEDICAL RECORD NO.:  [id number]                   PATIENT TYPE:  OUT   LOCATION:  MDC                                  FACILITY:  [facility]   PHYSICIAN:  Michael L. Thad Ranger, M.D.           DATE OF BIRTH:  [birth year]   DATE OF PROCEDURE:  09/13/2003  DATE OF DISCHARGE:                                 OPERATIVE REPORT   PROCEDURE:  Therapeutic lumbar puncture.   SURGEON:  Casimiro Needle L. Thad Ranger, M.D.   INDICATIONS FOR PROCEDURE:  Idiopathic intracranial hypertension with  increasing headache.   DESCRIPTION OF PROCEDURE:  Informed consent was obtained after the  procedure, risks, and benefits were explained to the patient and she agreed  to proceed.  The patient was placed in the right lateral decubitus position  and prepped and draped in the usual sterile fashion.  Local anesthesia was  achieved with 7 mL of lidocaine.  A 20 gauge spinal needle was inserted into  the L3-4 interspace and on the second pass of the needle, clear CSF was  returned.  Opening pressure was measured at 430 mmH2O.  CSF was then  withdrawn for a total quantity of approximately 22 mL to bring about a  closing pressure of approximately 135 mmH2O.  Routine samples were collected  for laboratory analysis including cell count, glucose, and protein.  During  and after the procedure, the patient complained of a headache which she has  had before during previous therapeutic taps.  Needle was withdrawn.  Hemostasis was obtained.  There were no immediate complications of the  procedure noted.  She was advised to remain in a supine position for an hour  and then may be discharged home.  She was given the usual postoperative  precautions including presenting to the emergency room if developing lower  extremity weakness, bowel or bladder incontinence, fever, or altered mental  status and notifying us if she developed a postural headache.  The headache  she developed during the procedure was treated symptomatically with Vicodin.                                               Michael L. Thad Ranger, M.D.    MLR/MEDQ  D:  09/13/2003  T:  09/13/2003  Job:  [id number]

## 2011-03-30 NOTE — Op Note (Signed)
NAMEVICKEE, MORMINO            ACCOUNT NO.:  [id number]   MEDICAL RECORD NO.:  [id number]          PATIENT TYPE:  INP   LOCATION:  3006                         FACILITY:  [facility]   PHYSICIAN:  Clydene Fake, M.D.  DATE OF BIRTH:  11/16/1975   DATE OF PROCEDURE:  06/20/2006  DATE OF DISCHARGE:                                 OPERATIVE REPORT   DIAGNOSIS:  Lumbar peritoneal shunt dysfunction.   POSTOP DIAGNOSIS:  Lumbar peritoneal shunt dysfunction.   PROCEDURE:  Explore lumbar peritoneal shunt with proximal revision with  reinsertion of intrathecal catheter.   SURGEON:  Clydene Fake, MD   ANESTHESIA:  General endotracheal tube.   ESTIMATED BLOOD LOSS:  Minimal.   DRAINS:  None.   COMPLICATIONS:  None.   REASON FOR PROCEDURE:  The patient is a 35 year old woman with pseudotumor  cerebri.  Had an LP shunt placed, with dysfunction of it distally and  revision 1 week ago with reinsertion into the abdomen.  Was doing fine.  Starting having headaches the last couple days.  Drainage from the incision  started the last day of clear drainage in the emergency room.  X-rays showed  that the proximal catheter going into the spine into the CSF seemed to stop  right at the deep part of the spinous process and the  lamina showing the  intrathecal catheter most likely pulled out, and the patient brought in for  exploration and revision of shunt.   PROCEDURE IN DETAIL:  The patient brought into the operating room and  general anesthesia induced.  The patient was placed in the prone position  and all pressure points padded.  She was prepped and draped in a sterile  fashion, and site of previous incision in the upper lumbar spine was opened.  We immediately saw spinal fluid in the __________ catheter tubing.  We  explored the catheter going down through the fascia.  It was __________  anchor, but it was loose, and we removed this part of the catheter was only  a couple cm long,  showing it did pull out.  We had CSF coming out of the  hole, going through the fascia at that point, and a 2-0 Vicryl suture was  then placed, and this stopped all leakage of CSF.  We then placed a spinal  needle down through the fascia into the intrathecal space.  We had good flow  of CSF.  We placed a new lumbar drain catheter through this needle,  __________  catheter up, and then removed the needle.  We had good flow of  CSF through the catheter.  We then placed an anchor over the catheter and  sutured it to the fascia and sutured the anchor to the catheter.  The  catheter was firmly in placed.  It did not slide through the anchor, and we  still had good flow of CSF.  We then connected the catheter to the  reservoir, which was already connected to a valve.  The old tubing, we  removed from the reservoir.  We sutured the intraventricular catheter to the  reservoir to  keep it firmly in place.  We then __________  tissue over the  fascia so that all the shunt tubing would lay down flat over the fascia.  We  made sure it was curled appropriately and it did not kink.  We then placed  some subcuticular stitches to hold all the shunt tubing apparatus down on  the fascia.  It was  irrigated with antibiotic solution, and then we closed the subcutaneous  tissue with 0-, 2-0 and 3-0 Vicryl interrupted sutures.  Skin closed with  benzoin and Steri-Strips.  Dressing was placed.  The patient was placed back  to a supine position, awoken from anesthesia, and transferred to the  recovery room in stable condition.           ______________________________  Clydene Fake, M.D.     JRH/MEDQ  D:  06/20/2006  T:  06/21/2006  Job:  [id number]

## 2011-03-30 NOTE — Op Note (Signed)
[name], [name]            ACCOUNT NO.:  [id number]   MEDICAL RECORD NO.:  [id number]          PATIENT TYPE:  OIB   LOCATION:  3172                         FACILITY:  [facility]   PHYSICIAN:  Coletta Memos, M.D.     DATE OF BIRTH:  [birth year]   DATE OF PROCEDURE:  05/09/2006  DATE OF DISCHARGE:                                 OPERATIVE REPORT   PREOPERATIVE DIAGNOSIS:  Pseudotumor cerebri.   POSTOPERATIVE DIAGNOSIS:  Pseudotumor cerebri.   COMPLICATIONS:  None.   INDICATIONS:  [name] is a 35 year old whom I took to the operating  room yesterday.  I was unable to satisfy myself that I had gained entrance  into the peritoneal cavity.  [name] initially had said that I could not  consult from the [facility] group and as they were the only  general surgeons available, it meant that she knew that if I could not gain  entry into the peritoneal cavity that I would abort the procedure.  That is  what occurred.  Subsequently, after she was awakened, we spoke again and she  did allow me to consult [facility].  I, therefore, had Dr. Jimmye Norman  assist in the case.  We bring her back today to do the procedure, for Dr.  Lindie Spruce to gain entry into the peritoneum for placement of a lumboperitoneal  shunt.   OPERATIVE NOTE:  [name] was brought to the operating room.  She was  placed under general anesthetic without difficulty.  She was then turned  into a right side down decubitus position.  She was prepped from table to  table on both sides.  She was then draped in a sterile fashion.  Dr. Lindie Spruce  then made an incision into the abdomen on the left side and under a separate  note will dictate, but he was able to go through the rectus abdominis,  anterior rectus abdominis, and the posterior rectus sheath, and gain entry  into the peritoneal cavity.  A red rubber Roxan Hockey was left in position so  that I could then placed the abdominal end of the catheter into  the  peritoneum.  After that was done, I then turned my attention to the lumbar  region.  I made an incision in the lumbar region.  I took this down to the  thoracolumbar fascia.  I then took a Tuohy needle and performed a spinal  tap.  I removed the needle and had free flow of spinal fluid which was  clear.  I then placed a catheter into the needle and was able to thread it  after I used the guidewire into the subarachnoid space.  I had free flow of  fluid out of the distal end of that catheter after the guide-wire was  removed.  I then removed the needle.  We then tunneled from the lumbar  incision to the abdominal incision.  I then was able to pull the catheter  was from the a lumbar region to the abdominal region.  I then connected the  catheter to the step up connector into the reservoir and  subsequently into  the HV valve.  That was secured and tied with a 2-0 silk suture.  I then  tied down both the lumbar catheter and then the HV valve into position onto  the fascia.  After seeing free flow of fluid from the distal end of the  peritoneal catheter, I then placed that into the peritoneum.  Dr. Phoebe Perch  then closed the lumbar incision and I closed the abdominal incision with  Vicryl sutures.  I closed the posterior rectus sheath, anterior rectus  sheath, subcutaneous tissues, and subcuticular layer.  I then irrigated both  wounds.  I then closed the wounds with Dermabond.  There was one incision  made on her flank which aided in the tunneling and that was closed also with  Vicryl and Dermabond.  She tolerated procedure well.          ______________________________  Coletta Memos, M.D.    KC/MEDQ  D:  05/09/2006  T:  05/09/2006  Job:  [id number]

## 2011-03-30 NOTE — Op Note (Signed)
[name], [name]            ACCOUNT NO.:  [id number]   MEDICAL RECORD NO.:  [id number]          PATIENT TYPE:  AMB   LOCATION:  SDS                          FACILITY:  [facility]   PHYSICIAN:  Coletta Memos, M.D.     DATE OF BIRTH:  15-Apr-1976   DATE OF PROCEDURE:  05/08/2006  DATE OF DISCHARGE:                                 OPERATIVE REPORT   PREOPERATIVE DIAGNOSIS:  Pseudotumor cerebri.   POSTOPERATIVE DIAGNOSIS:  Pseudotumor cerebri.   PROCEDURE:  Aborted attempt at placement of lumboperitoneal shunt.   COMPLICATIONS:  Inability to confirm that I was in the peritoneal cavity.   SURGEON:  Coletta Memos, M.D.   ANESTHESIA:  General endotracheal.   INDICATIONS:  [name] is a 35 year old who has pseudotumor cerebri.  She is overweight and is in need of a lumboperitoneal shunt.  Secondary to a  statement she made which we discussed at length that she would not anybody  from the [facility] group assisting and them being the only  general surgeons available in the [facility], I explained to her  that if I was unable to access the peritoneal cavity that I would abort the  procedure.  [name] has had surgery in the past and had a bile duct leak  and certainly had some adhesions on that inner abdomen and that is why we  discussed that preoperatively.   OPERATIVE NOTE:  [name] was brought to the operating room, intubated,  and placed under general anesthetic without difficulty.  She was placed with  her left side down.  Her back and abdomen was prepped and draped in a  sterile fashion.  I infiltrated 10 mL, I believe, into the abdominal region  about 3-4 fingerbreadths above the umbilicus and just off the midline to the  right side.  I opened the skin with a #10 blade and I took this down through  a thick layer of subcutaneous fat.  I then was able to identify the anterior  rectus sheath and I opened that in a horizontal fashion with a 10  blade.  I  then identified the rectus abdominis and simply pulled that apart and opened  the posterior rectus sheath.  At that time, I used two clamps and pulled up  on what was a thin membrane which I suspected was the peritoneum and I  opened that with Metzenbaum scissors.  I then had immediate egress of fat.  However, I was not able to easily pass a red rubber catheter through that  fat so I was not convinced I was actually in the peritoneum.  I then, over  the next 1 1/2 to 2 hours, tried various maneuvers to gain access to the  peritoneal cavity and to ensure that I was in the peritoneal cavity.  I  injected some air into the space that I had dissected, took an x-ray to see  if I had free air which would be seen on the decubitus film, and no free air  was seen.  Air was only seen at the area of my dissection.  I  then used a  laparoscope but was unable to see a definitive space.  I then used a  pediatric endoscope and with that, I again was unable to confirm that I was  in the peritoneal cavity.  Again this was done over a long period of time  using various blunt dissectors and the light.  I never saw bowel contents  come up out of the wound.   As I was unable to satisfying myself that I was definitely in the peritoneal  cavity, I stopped the procedure.  I reapproximated the posterior rectal  sheath, the anterior rectus sheath, subcutaneous tissue, and subcuticular  layer.  Dermabond was used for a sterile dressing.   I explained to [name] after she was extubated what had occurred in the  case and she did allow me to have Dr. Jimmye Norman of [facility] come  up to do a consultation and as of now, our plan is to do the case tomorrow  if she understands what the problem was.  May 07, 2006           ______________________________  Coletta Memos, M.D.     KC/MEDQ  D:  05/08/2006  T:  05/08/2006  Job:  [id number]

## 2011-03-30 NOTE — Op Note (Signed)
[name], [name]            ACCOUNT NO.:  [id number]   MEDICAL RECORD NO.:  [id number]          PATIENT TYPE:  INP   LOCATION:  3008                         FACILITY:  [facility]   PHYSICIAN:  Clydene Fake, M.D.  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   PROCEDURE:  Removal of lumboperitoneal shunt.   SURGEON:  Clydene Fake, M.D.   ANESTHESIA:  General endotracheal tube anesthesia.   ESTIMATED BLOOD LOSS:  Nil.   BLOOD GIVEN:  None.   DRAINS:  None.   COMPLICATIONS:  None.   SPECIMENS:  Proximal and distal tubing sent for culture.  Posterior wound  culture also sent, and CSF sent for cell count, Gram stain, protein, glucose  and cultures.   REASON FOR PROCEDURE:  The patient is a 35 year old woman with pseudotumor,  who had an LP shunt placed by Dr. Franky Macho that was complicated by distal  failure, which had reoperation and reinsertion of tubing into the abdomen,  and then a proximal failure with shunt tubing coming out of the CSF space  and a second revision just a few weeks ago to have that replaced.  Then, she  was doing well until this last week, when she had severe abdominal pain,  fevers, raised white count.  And this was all getting worse despite IV  antibiotics the last few days.  Blood cultures have been negative to date.  The patient with presumed shunt infection.  General surgery was consulted.  CT abdomen was done, showing no gross significant findings.  It was elected  to take the LP shunt out and to send it off for cultures.   DESCRIPTION OF PROCEDURE IN DETAIL:  The patient was brought into the  operating room.  General  anesthesia was induced.  The patient was placed on  the OR table, positioned on the left side, taped up, and then the patient  was prepped and draped in a sterile fashion.  First, a posterior incision in  the midline over the upper lumber spine was incised and the incision taken  down to the fascia, where we found the shunt tubing valve and reservoir.  We  clamped the tubing anterior and distal.  Cut the tubing and sent CSF.  As we  pulled the catheter out, there were 4 stays, and we pulled the proximal  catheter out and cut the distal end off and sent that for culture.  And some  culture of the wound area was also done, but this had no purulent material.  Then, attention was taken to the left abdomen, where at the prior incision,  another incision was made and dissection now taken through the subcutaneous  tissue, where the shunt catheter was found.  We followed and we pulled the  catheter, however, from the posterior aspect to anterior, and then  we  followed the catheter down to where it was scarred in by a connector and a  stitch.  We loosened it up there, removing the catheter.  We placed some  stitches in the scar, where it entered the abdomen, to close that space.  The wound was irrigated with antibiotic solution.  The wound was irrigated  with antibiotic solution.  The subcutaneous tissue was closed with 2-0  Vicryl interrupted sutures.  Skin closed with staples.  A dressing was  placed.  Attention was taken back to the posterior aspect.  We placed a 2-0  Vicryl suture in the fascia, where the tubing came out to block any possible  CSF escape, though there was no CSF coming out.  The wound was irrigated  with antibiotic solution and the subcutaneous tissue closed with 2-0 Vicryl  interrupted suture, and the skin closed with staples.  Dressing was placed  on both incisions.  The patient was placed back in supine position, awakened  from anesthesia, extubated and transferred to the  recovery room in stable  condition.           ______________________________  Clydene Fake, M.D.     JRH/MEDQ  D:  08/04/2006  T:  08/04/2006  Job:  [id number]   cc:   Coletta Memos, M.D.

## 2011-03-30 NOTE — Op Note (Signed)
   NAME:  [name], [name]                      ACCOUNT NO.:  [id number]   MEDICAL RECORD NO.:  [id number]                   PATIENT TYPE:  OUT   LOCATION:  MDC                                  FACILITY:  [facility]   PHYSICIAN:  Michael L. Thad Ranger, M.D.           DATE OF BIRTH:  Jul 03, 1976   DATE OF PROCEDURE:  08/06/2002  DATE OF DISCHARGE:                                 OPERATIVE REPORT   PROCEDURE:  Therapeutic lumbar puncture.   OPERATOR:  Michael L. Thad Ranger, M.D.   INDICATIONS:  Idiopathic intracranial hypertension (pseudotumor cerebri).   DESCRIPTION OF PROCEDURE:  Informed consent was signed and placed in the  patient's chart after the procedure, the risks and benefits fo the procedure  were discussed with the patient and she agreed to proceed.  The patient was  placed in the right lateral decubitus position and prepped and draped in the  usual sterile fashion.  Local anesthesia was achieved with 3 cc of  lidocaine, and a 20-gauge spinal needle was inserted into the L3-4  interspace and advanced until clear CSF was obtained.  Opening pressure was  measured directly at 330 mmH2O.  CSF was then obtained for laboratory  samples as follows:  Tube 1, 2 cc, cell count, differential; tube 2, 2 cc,  protein and glucose.  Further CSF was drained to achieve a closing pressure  of 120 mmH2O.  We drained approximately 16-17 cc further of clear CSF.  The  needle was then withdrawn and hemostasis was obtained.  No immediate  complications of the procedure were noted.  The patient was advised to  remain supine for one hour following the procedure and then may be  discharged home.  She is advised to call immediately or go to the emergency  room if she developed fever or headache and to let the office or the on-call  physician know if she developed a postural headache over the next few days.  She will follow up by phone early next week.      Michael L. Thad Ranger, M.D.    MLR/MEDQ  D:  08/06/2002  T:  08/06/2002  Job:  [id number]

## 2011-03-30 NOTE — Discharge Summary (Signed)
[name], Cummings                      ACCOUNT NO.:  [id number]   MEDICAL RECORD NO.:  [id number]                   PATIENT TYPE:  INP   LOCATION:  5029                                 FACILITY:  [facility]   PHYSICIAN:  Darius Bump, M.D.             DATE OF BIRTH:  04/22/1976   DATE OF ADMISSION:  02/07/2004  DATE OF DISCHARGE:  02/17/2004                                 DISCHARGE SUMMARY   ADMISSION DIAGNOSES:  1. Fever, unknown origin.  2. Status post recent cholecystectomy.  3. Elevation in liver tests.   DISCHARGE DIAGNOSES:  1. 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  2. Status post cholecystectomy with evidence of a probable bile leak, status     post stent placement.  3. Elevation in liver tests, normalizing, felt due to #1 and 2.  4. 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   BRIEF HISTORY:  Ms. [name] is a 35 year old woman with a history  pseudotumor cerebri, obesity, hypothyroidism, who was 2 weeks status post a  cholecystectomy when she was admitted for recurrent abdominal pain and a 1-  to 2-week history of intermittent fever which has been as high as 101.  She  has had some nausea and vomiting and aches.   PHYSICAL EXAMINATION:  VITAL SIGNS:  Exam was remarkable for a temperature  of 101.1, blood pressure 124/64, heart rate 88, respiratory rate 20.  HEENT:  Unremarkable.  NECK:  Unremarkable.  LUNGS:  Lungs were clear.  HEART:  Regular rhythm and rate without murmur.  ABDOMEN:  Abdomen was obese, soft with mild diffuse tenderness and  splenomegaly.  EXTREMITIES:  Extremities had no edema.  NEUROLOGIC:  Exam was nonfocal.   LABORATORY AND ACCESSORY CLINICAL DATA:  White count was 11.2 with atypical  lymphocytes.  Potassium was 3.3.  ALP was 131, ALT was 121; amylase, lipase,  UA, monospot and urine  hCG were all negative.   Chest x-ray at the time of admission showed mild bibasilar atelectasis which  was improved compared to a January 22, 2004 chest x-ray.   CT of the abdomen and pelvis showed a small amount of pelvic ascites,  possibly due to ovarian cyst rupture, sigmoid diverticulosis and a normal  appendix.   HOSPITAL COURSE:  PROBLEM #1 - FEVER AND SPLENOMEGALY/ATYPICAL LYMPHOCYTES:  Viral serologies were checked.  Monospot was negative.  HIV was reactive,  but with an indeterminate western blot.  TCR was ultimately done and was  negative.  Blood cultures were negative.  RPR was nonreactive.  CMV was  strongly positive at 4.24 IgM and an IgG of 8.15.  CMV titers were within  normal limits.  Workup of her pain included an MRI and MRA of the abdomen  which were done on February 10, 2004.  It did show a wedge-shaped defect  within  the spleen which was consistent with splenic infarct.  Also, some fluid was  seen in the gallbladder fossa and some small fluid at the tip of the liver  as well as at the tip of the spleen.   General surgery and ID consults were obtained.  It was felt that her fever  was ultimately due to CMV and she did defervesce slightly throughout  hospitalization and was improved at the time of discharge.   PROBLEM #2 - STATUS POST CHOLECYSTECTOMY WITH FLUID COLLECTIONS AS ABOVE:  It was felt that she may have a very mild bile leak.  A GI consultation was  obtained from Dr. Evette Cristal and plan was made for an ERCP and stenting, which  she underwent on February 11, 2004 by Dr. Evette Cristal.   PROBLEM #3 - HISTORY OF PSEUDOTUMOR CEREBRI:  Given her history, there was  concern about headache and low-grade fever.  She did undergo a lumbar  puncture on February 07, 2004; 4 tubes of clear colorless fluid were obtained.  CSF cultures were negative.  There were 3 white cells seen.  Glucose and  protein were within normal limits.   PROBLEM #4 - PLEURITIC CHEST PAIN AND SHORTNESS OF BREATH:   The patient  developed some pleuritic chest pain and shortness of breath during the  hospitalization.  Due to her medical problems, she underwent a spiral CT of  the chest which was without evidence of pulmonary embolus.  Those symptoms  were improved at the time of discharge.   CONSULTATIONS:  1. Dr. Ollen Gross. Carolynne Edouard, general surgery.  2. Dr. Graylin Shiver, GI.  3. Dr. Lacretia Leigh. Hatcher, ID.   PROCEDURES:  1. Fluoroscopically-guided lumbar puncture, February 08, 2004.  2. Endoscopic retrograde cholangiopancreatogram with biliary stent     placement, Dr. Evette Cristal, February 11, 2004.   RADIOLOGY:  Two-view of the chest on admission was with slight bibasilar  atelectasis.   Abdominal and pelvic CT on February 07, 2004 showed a small amount of pelvic  ascites, sigmoid diverticulosis, normal appendix and striated nephrograms;  UA was unremarkable and this was felt to be an incidental finding.   On February 10, 2004, she had an MRA of the abdomen and pelvis that showed  evidence of a splenic infarct, as above.  The renal arteries were normal.  Celiac, SMA, IMA arteries are normal.   Chest CT done on February 09, 2004 was negative for pulmonary embolus.  There  was slight right hilar adenopathy, pulmonary basilar subsegmental  atelectasis.   LABORATORY:  On admission, her white count was 11.2; this was down to 8.3 by  February 10, 2004.  Her hemoglobin was 11.9 and down to 10.5 at the time of  discharge.  Her INR was normal.  Sodium on admission was 3.3 but was normal  at the time of discharge.  Albumin was 2.3 at the time of admission and 1.9  at the time of discharge.  AST was 131, ALT of 121; admission 84 and 75 at  discharge.  Bilirubin and alkaline phosphatase remained normal.  CK was 20.  TSH and free T4 were checked and were normal.  Hepatitis B surface antibody was negative; hepatitis B core IgM antibody was detected.  As above, HIV was  reactive.  Western blot was indeterminate and HIV RNA  UltraQuant was normal.  Urine hCG was negative.  CSF was as above and within normal limits.  Blood  cultures and urine cultures were normal/negative.  Monospot  was negative.  RPR was negative.  As above, CMV was positive.  EBV serologies were normal.  T-helper cell count was normal at 980.   CONDITION ON DISCHARGE:  Improved.   DISCHARGE MEDICATIONS:  1. Synthroid 100 mcg p.o. daily.  2. Diflucan 100 mg daily for 5 days.  3. Ativan 0.5 mg up to twice __________  for anxiety.  4. Ultram q.8 h. p.r.n. for pain.  5. Phenergan 12.5 mg as needed for nausea.   FOLLOWUP:  She is to follow up with Dr. Evette Cristal in 4-6 weeks for removal of  stent and Dr. Darius Bump within a week of discharge.  On outpatient  status, hepatitis B status will need to be rechecked.  She did have a  negative hepatitis B surface antibody but positive hepatitis B core  antibody.                                                Darius Bump, M.D.    MJM/MEDQ  D:  04/03/2004  T:  04/04/2004  Job:  [id number]   cc:   Graylin Shiver, M.D.  [address]  [address]  [place], [place] [address]  Fax: [phone]   Ollen Gross. Vernell Morgans, M.D.  [address]  [place]  [place] [address]  Fax: [phone]   Lacretia Leigh. Ninetta Lights, M.D.  [address]  [place]  [place] [address]  Fax: [phone]

## 2011-03-30 NOTE — H&P (Signed)
NAMELILLIONA, [name]                      ACCOUNT NO.:  [id number]   MEDICAL RECORD NO.:  [id number]                   PATIENT TYPE:  INP   LOCATION:  2033                                 FACILITY:  [facility]   PHYSICIAN:  Lilla Shook, M.D.            DATE OF BIRTH:  Jul 23, 1976   DATE OF ADMISSION:  02/07/2004  DATE OF DISCHARGE:                                HISTORY & PHYSICAL   CHIEF COMPLAINT:  Fever.   HISTORY OF PRESENT ILLNESS:  Ciclaly is a 35 year old young woman with a  history of pseudotumor cerebri with cholecystectomy about three weeks ago.  She states that she has abdominal pain that occurred at least three days  after the surgery.  She had temperatures up to 102 and went to the emergency  room for evaluation.  She had a HIDA scan which was unremarkable and a chest  x-ray that was also okay.  Evidently, she was sent home with some Avelox  therapy at one time.  She had a bronchitis not long ago.  She had developed  some headache and ached all over her body.  She saw the doctor a couple of  weeks after her surgery and continued to have pain.  She came to see Dr.  Lynelle Doctor, physicians at [facility], for follow up of her abdominal  pain.  Today, she had high temperatures up to 102 and greater associated  with diffuse abdominal pain with normal bowel and bladder habits.   REVIEW OF SYMPTOMS:  GASTROINTESTINAL:  Remarkable for decreased appetite,  positive nausea, no herpetic outbreaks.  NEUROLOGIC:  No seizures, positive  chills.   ALLERGIES:  CODEINE.   MEDICATIONS:  1. Aspirin q.d.  2. Synthroid 100 mcg q.d.   PAST MEDICAL HISTORY:  1. Pseudotumor cerebri.  2. Obesity.  3. Hypothyroidism.  4. Oral contraception.   SOCIAL HISTORY:  She lives alone.  Former tobacco use about two years ago.  No alcohol.  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   FAMILY HISTORY:  Her father and mother both have hypertension and elevated  cholesterol.   PHYSICAL EXAMINATION:  GENERAL:  Without distress.  VITAL SIGNS:  T-max 101.1, blood pressure 124/64, pulse 85, respirations 14.  HEENT:  Normocephalic without trauma.  No papilledema.  Pupils are reactive.  NECK:  Supple.  LUNGS:  Clear to auscultation bilaterally.  HEART:  Regular with normal S1, S2 without murmurs appreciated.  ABDOMEN:  Obese with good bowel sounds, soft, diffusely tender without point  tenderness.  Enlarged spleen two to three fingerbreadths below the rib cage.  No rebound or guarding.  GENITALIA:  Normal female external genitalia.  EXTREMITIES:  No edema, 2+ DP pulses.  SKIN:  Warm and dry.  NEUROLOGIC:  Alert and oriented x3.  Occasional nausea.  Cranial nerves 2-12  grossly intact.  Strength is 5/5 bilaterally.   LABORATORY DATA AND X-RAY FINDINGS:  Hemoglobin 11.9, WBC  11.2, neutrophils  29%, atypical lymphocytes.  Potassium 3.3, BUN 9, creatinine 1.1.  AST 131,  ALT 121, protein 5.9, albumin 2.3.  Urinalysis negative.  Monospot negative.  Amylase and lipase normal.  Urine HCG negative.   CT scan of the abdomen on March 18, showed cholecystectomy with some changes  of the gallbladder fossa, otherwise negative.  CT scan performed in the  emergency room tonight was remarkable for nephrograms in the lower poles,  question pyelonephritis, pelvic ascites with question of ruptured cyst.  She  also has splenomegaly with probable small splenic infarct.  Chest x-ray  showed residual atelectasis, otherwise no active disease.   IMPRESSION:  1. Abdominal pain with splenomegaly.  Her monospot test is negative.  Will     wonder about an Epstein-Barr or herpes virus.  She is also postoperative     and need to be careful about postoperative infection of the abdomen close     to the liver.  2. Hypothyroidism.  3. Hypokalemia.  4. Elevated liver function tests questionably due to infection.   PLAN:  1. Admit to telemetry.  2. Hydrate.  3. Use  antibiotics with broad coverage and obtain Epstein-Barr.  4. Replete her potassium.  5. Continue to monitor labs.  6. She is amenable to a consult with infectious disease.  7. Dr. [name] is her neurologist and she has an appointment to see     him soon.                                                Lilla Shook, M.D.    SEJ/MEDQ  D:  02/08/2004  T:  02/08/2004  Job:  [id number]   cc:   Darius Bump, M.D.  [place]  [place] [address]  Fax: [phone]

## 2011-08-01 LAB — PROTEIN AND GLUCOSE, CSF: Total  Protein, CSF: 18

## 2011-08-01 LAB — CSF CELL COUNT WITH DIFFERENTIAL
RBC Count, CSF: 3 — ABNORMAL HIGH
Tube #: 1
WBC, CSF: 4

## 2011-08-13 LAB — CSF CELL COUNT WITH DIFFERENTIAL

## 2011-08-23 LAB — GRAM STAIN

## 2011-08-23 LAB — CSF CELL COUNT WITH DIFFERENTIAL: WBC, CSF: 1

## 2011-08-27 LAB — CSF CELL COUNT WITH DIFFERENTIAL: WBC, CSF: 2

## 2011-08-27 LAB — PROTEIN AND GLUCOSE, CSF: Glucose, CSF: 50

## 2019-10-23 ENCOUNTER — Other Ambulatory Visit: Payer: Self-pay

## 2019-10-23 DIAGNOSIS — Z20822 Contact with and (suspected) exposure to covid-19: Secondary | ICD-10-CM

## 2019-10-25 LAB — NOVEL CORONAVIRUS, NAA: SARS-CoV-2, NAA: NOT DETECTED

## 2019-10-26 ENCOUNTER — Telehealth: Payer: Self-pay | Admitting: General Practice

## 2019-10-26 NOTE — Telephone Encounter (Signed)
Negative COVID results given. Patient results "NOT Detected." Caller expressed understanding. ° °

## 2023-12-23 ENCOUNTER — Encounter: Payer: Self-pay | Admitting: Emergency Medicine

## 2023-12-23 ENCOUNTER — Ambulatory Visit
Admission: EM | Admit: 2023-12-23 | Discharge: 2023-12-23 | Disposition: A | Discharge status: Home / Self Care | Payer: MEDICAID | Attending: Family Medicine | Admitting: Family Medicine

## 2023-12-23 DIAGNOSIS — J111 Influenza due to unidentified influenza virus with other respiratory manifestations: Secondary | ICD-10-CM

## 2023-12-23 DIAGNOSIS — R030 Elevated blood-pressure reading, without diagnosis of hypertension: Secondary | ICD-10-CM | POA: Diagnosis not present

## 2023-12-23 DIAGNOSIS — J441 Chronic obstructive pulmonary disease with (acute) exacerbation: Secondary | ICD-10-CM | POA: Diagnosis not present

## 2023-12-23 LAB — POCT INFLUENZA A/B
Influenza A, POC: NEGATIVE
Influenza B, POC: NEGATIVE

## 2023-12-23 LAB — POC SARS CORONAVIRUS 2 AG -  ED: SARS Coronavirus 2 Ag: NEGATIVE

## 2023-12-23 MED ORDER — PREDNISONE 20 MG PO TABS: 40.0000 mg | ORAL_TABLET | Freq: Every day | ORAL | 0 refills | Status: DC

## 2023-12-23 MED ORDER — ALBUTEROL SULFATE HFA 108 (90 BASE) MCG/ACT IN AERS: 1.0000 | INHALATION_SPRAY | Freq: Four times a day (QID) | RESPIRATORY_TRACT | 0 refills | Status: DC | PRN

## 2023-12-23 MED ORDER — PROMETHAZINE-DM 6.25-15 MG/5ML PO SYRP: 5.0000 mL | ORAL_SOLUTION | Freq: Four times a day (QID) | ORAL | 0 refills | Status: DC | PRN

## 2023-12-23 MED ORDER — ALBUTEROL SULFATE HFA 108 (90 BASE) MCG/ACT IN AERS: 1.0000 | INHALATION_SPRAY | Freq: Four times a day (QID) | RESPIRATORY_TRACT | 0 refills | Status: AC | PRN

## 2023-12-23 MED ORDER — PREDNISONE 20 MG PO TABS: 40.0000 mg | ORAL_TABLET | Freq: Every day | ORAL | 0 refills | Status: AC

## 2023-12-23 MED ORDER — PROMETHAZINE-DM 6.25-15 MG/5ML PO SYRP: 5.0000 mL | ORAL_SOLUTION | Freq: Four times a day (QID) | ORAL | 0 refills | Status: AC | PRN

## 2023-12-23 NOTE — Discharge Instructions (Signed)
 Make sure you drink lots of fluids Use your albuterol  as needed for shortness of breath Continue Qvar every day Take prednisone  once a day for 5 days I have given you a stronger cough medicine to use if needed See your doctor if not improving by the end of the week  You need to follow-up on your elevated blood pressure

## 2023-12-23 NOTE — ED Provider Notes (Signed)
 [facility]    CSN: [id number] Arrival date & time: 12/23/23  1911      History   Chief Complaint Chief Complaint  Patient presents with   Cough    HPI [name] is a 48 y.o. female.   Patient is here for fever body aches chills and cough.  She does have underlying COPD.  She also has pseudotumor cerebri, fibromyalgia, and thyrotoxicosis.  Hypertension and hyperlipidemia noted in record. On arrival her blood pressure is very high. She is also a smoker    History reviewed. No pertinent past medical history.  Patient Active Problem List   Diagnosis Date Noted   FIBROMYALGIA 12/24/2006   Thyrotoxicosis 08/29/2006   PSEUDOTUMOR CEREBRI 08/29/2006   Essential hypertension 08/29/2006   CHOLECYSTECTOMY, HX OF 08/29/2006   Local infection of skin and subcutaneous tissue 08/01/2006    History reviewed. No pertinent surgical history.  OB History   No obstetric history on file.      Home Medications    Prior to Admission medications   Medication Sig Start Date End Date Taking? Authorizing Provider  albuterol  (VENTOLIN  HFA) 108 (90 Base) MCG/ACT inhaler Inhale 2 puffs into the lungs every 4 (four) hours as needed. 03/01/21  Yes [provider]  amLODipine (NORVASC) 10 MG tablet Take 1 tablet by mouth daily. 10/30/17  Yes [provider]  furosemide (LASIX) 40 MG tablet Take 1 tablet by mouth daily. 12/19/22  Yes [provider]  gabapentin (NEURONTIN) 800 MG tablet Take 1 tablet by mouth 2 (two) times daily. 11/20/22  Yes [provider]  Levothyroxine Sodium 150 MCG CAPS Take by mouth. 12/06/16  Yes [provider]  lisinopril (ZESTRIL) 40 MG tablet Take 1 tablet by mouth daily. 10/01/23  Yes [provider]  metoprolol succinate (TOPROL-XL) 50 MG 24 hr tablet Take 1 tablet by mouth daily. 10/17/23  Yes [provider]  albuterol  (VENTOLIN  HFA) 108 (90 Base) MCG/ACT inhaler Inhale 1-2 puffs into  the lungs every 6 (six) hours as needed for wheezing or shortness of breath. 12/23/23   Stephany Ehrich, MD  atorvastatin (LIPITOR) 20 MG tablet Take 20 mg by mouth daily.   Yes [provider]  busPIRone (BUSPAR) 30 MG tablet Take 30 mg by mouth 2 (two) times daily.   Yes [provider]  metFORMIN (GLUCOPHAGE-XR) 500 MG 24 hr tablet Take 500 mg by mouth daily.   Yes [provider]  methadone (DOLOPHINE) 5 MG tablet Take 5 mg by mouth.   Yes [provider]  mirtazapine (REMERON) 45 MG tablet Take 45 mg by mouth at bedtime.   Yes [provider]  omeprazole (PRILOSEC) 40 MG capsule Take 40 mg by mouth daily.   Yes [provider]  oxcarbazepine (TRILEPTAL) 600 MG tablet Take 600 mg by mouth.   Yes [provider]  predniSONE  (DELTASONE ) 20 MG tablet Take 2 tablets (40 mg total) by mouth daily with breakfast. 12/23/23   Stephany Ehrich, MD  promethazine -dextromethorphan (PROMETHAZINE -DM) 6.25-15 MG/5ML syrup Take 5 mLs by mouth 4 (four) times daily as needed for cough. 12/23/23   Stephany Ehrich, MD  QVAR REDIHALER 80 MCG/ACT inhaler Inhale into the lungs.   Yes [provider]    Family History Family History  Problem Relation Age of Onset   Healthy Mother     Social History Social History   Tobacco Use   Smoking status: Every Day    Types: Cigarettes  Smokeless tobacco: Never  Vaping Use   Vaping status: Never Used  Substance Use Topics   Alcohol use: Not Currently   Drug use: Not Currently     Allergies   Cefazolin   Review of Systems Review of Systems See HPI  Physical Exam Triage Vital Signs ED Triage Vitals  Encounter Vitals Group     BP 12/23/23 1921 (!) 199/114     Systolic BP Percentile --      Diastolic BP Percentile --      Pulse Rate 12/23/23 1921 90     Resp 12/23/23 1921 18     Temp 12/23/23 1921 (!) 100.6 F (38.1 C)     Temp Source 12/23/23 1921 Oral     SpO2 12/23/23  1921 97 %     Weight 12/23/23 1923 242 lb (109.8 kg)     Height 12/23/23 1923 5\' 3"  (1.6 m)     Head Circumference --      Peak Flow --      Pain Score 12/23/23 1923 6     Pain Loc --      Pain Education --      Exclude from Growth Chart --    No data found.  Updated Vital Signs BP (!) 180/100 (BP Location: Right Arm)   Pulse 86   Temp 98.8 F (37.1 C) (Oral)   Resp 18   Ht 5\' 3"  (1.6 m)   Wt 109.8 kg   SpO2 92%   BMI 42.87 kg/m       Physical Exam Constitutional:      General: She is not in acute distress.    Appearance: She is well-developed. She is ill-appearing.  HENT:     Head: Normocephalic and atraumatic.  Eyes:     Conjunctiva/sclera: Conjunctivae normal.     Pupils: Pupils are equal, round, and reactive to light.  Cardiovascular:     Rate and Rhythm: Normal rate.  Pulmonary:     Effort: Pulmonary effort is normal. No respiratory distress.     Breath sounds: Wheezing present.     Comments: Few scattered wheeze Abdominal:     General: There is no distension.     Palpations: Abdomen is soft.  Musculoskeletal:        General: Normal range of motion.     Cervical back: Normal range of motion.  Skin:    General: Skin is warm and dry.  Neurological:     Mental Status: She is alert.      UC Treatments / Results  Labs (all labs ordered are listed, but only abnormal results are displayed) Labs Reviewed  POCT INFLUENZA A/B - Normal  POC SARS CORONAVIRUS 2 AG -  ED    EKG   Radiology No results found.  Procedures Procedures (including critical care time)  Medications Ordered in UC Medications - No data to display  Initial Impression / Assessment and Plan / UC Course  I have reviewed the triage vital signs and the nursing notes.  Pertinent labs & imaging results that were available during my care of the patient were reviewed by me and considered in my medical decision making (see chart for details).     Final Clinical Impressions(s) / UC  Diagnoses   Final diagnoses:  Elevated blood pressure reading  Influenza-like illness  COPD exacerbation [facility])     Discharge Instructions      Make sure you drink lots of fluids Use your albuterol  as needed for shortness of  breath Continue Qvar every day Take prednisone  once a day for 5 days I have given you a stronger cough medicine to use if needed See your doctor if not improving by the end of the week  You need to follow-up on your elevated blood pressure     ED Prescriptions     Medication Sig Dispense Auth. Provider   albuterol  (VENTOLIN  HFA) 108 (90 Base) MCG/ACT inhaler Inhale 1-2 puffs into the lungs every 6 (six) hours as needed for wheezing or shortness of breath. 18 g Stephany Ehrich, MD   predniSONE  (DELTASONE ) 20 MG tablet Take 2 tablets (40 mg total) by mouth daily with breakfast. 10 tablet Stephany Ehrich, MD   promethazine -dextromethorphan (PROMETHAZINE -DM) 6.25-15 MG/5ML syrup Take 5 mLs by mouth 4 (four) times daily as needed for cough. 118 mL Stephany Ehrich, MD      PDMP not reviewed this encounter.   Stephany Ehrich, MD 12/23/23 2014

## 2023-12-23 NOTE — ED Triage Notes (Signed)
 Patient c/o possible flu, cough, congestion, body aches and chills that started last night.  Patient does have COPD.  Patient has taken Tylenol and Ibuprofen.

## 2023-12-24 ENCOUNTER — Telehealth: Payer: Self-pay

## 2023-12-24 NOTE — Telephone Encounter (Signed)
Pt called stating she isnt feeling much better since yesterdays UC visit. Per Dr Delton See, advised to continue to push fluids and take meds as prescribed, space out if needed. Call PCP for f/u if no improvement by end of week.

## 2024-02-11 DEATH — deceased
